# Patient Record
Sex: Male | Born: 1940 | Race: White | Hispanic: No | Marital: Married | Smoking: Never smoker
Health system: Southern US, Community
[De-identification: ages and names within clinical notes are randomized; demographics above are authoritative.]

## PROBLEM LIST (undated history)

## (undated) DIAGNOSIS — I10 Essential (primary) hypertension: Secondary | ICD-10-CM

## (undated) DIAGNOSIS — F0280 Dementia in other diseases classified elsewhere without behavioral disturbance: Secondary | ICD-10-CM

---

## 1998-11-18 ENCOUNTER — Encounter: Payer: Self-pay | Admitting: Urology

## 1998-11-18 ENCOUNTER — Ambulatory Visit (HOSPITAL_COMMUNITY): Admission: RE | Admit: 1998-11-18 | Discharge: 1998-11-18 | Payer: Self-pay | Admitting: Urology

## 1998-12-01 ENCOUNTER — Encounter: Payer: Self-pay | Admitting: General Surgery

## 1998-12-02 ENCOUNTER — Encounter (INDEPENDENT_AMBULATORY_CARE_PROVIDER_SITE_OTHER): Payer: Self-pay | Admitting: Specialist

## 1998-12-02 ENCOUNTER — Observation Stay (HOSPITAL_COMMUNITY): Admission: RE | Admit: 1998-12-02 | Discharge: 1998-12-03 | Payer: Self-pay | Admitting: General Surgery

## 1998-12-02 ENCOUNTER — Encounter: Payer: Self-pay | Admitting: General Surgery

## 1999-12-12 ENCOUNTER — Encounter: Payer: Self-pay | Admitting: Urology

## 1999-12-12 ENCOUNTER — Ambulatory Visit (HOSPITAL_COMMUNITY): Admission: RE | Admit: 1999-12-12 | Discharge: 1999-12-12 | Payer: Self-pay | Admitting: Urology

## 2003-07-02 ENCOUNTER — Emergency Department (HOSPITAL_COMMUNITY): Admission: EM | Admit: 2003-07-02 | Discharge: 2003-07-02 | Payer: Self-pay | Admitting: Emergency Medicine

## 2003-07-13 ENCOUNTER — Emergency Department (HOSPITAL_COMMUNITY): Admission: EM | Admit: 2003-07-13 | Discharge: 2003-07-13 | Payer: Self-pay | Admitting: Emergency Medicine

## 2003-08-18 ENCOUNTER — Ambulatory Visit (HOSPITAL_COMMUNITY): Admission: RE | Admit: 2003-08-18 | Discharge: 2003-08-18 | Payer: Self-pay | Admitting: Family Medicine

## 2003-09-01 ENCOUNTER — Encounter: Admission: RE | Admit: 2003-09-01 | Discharge: 2003-09-01 | Payer: Self-pay | Admitting: Neurology

## 2003-09-10 ENCOUNTER — Emergency Department (HOSPITAL_COMMUNITY): Admission: EM | Admit: 2003-09-10 | Discharge: 2003-09-10 | Payer: Self-pay | Admitting: Emergency Medicine

## 2009-03-09 ENCOUNTER — Ambulatory Visit: Payer: Self-pay | Admitting: Gastroenterology

## 2009-03-11 ENCOUNTER — Telehealth: Payer: Self-pay | Admitting: Gastroenterology

## 2009-03-23 ENCOUNTER — Encounter: Payer: Self-pay | Admitting: Gastroenterology

## 2009-03-23 ENCOUNTER — Ambulatory Visit: Payer: Self-pay | Admitting: Gastroenterology

## 2009-03-25 ENCOUNTER — Encounter: Payer: Self-pay | Admitting: Gastroenterology

## 2010-07-08 ENCOUNTER — Encounter: Payer: Self-pay | Admitting: Family Medicine

## 2014-01-28 ENCOUNTER — Encounter: Payer: Self-pay | Admitting: Gastroenterology

## 2020-10-19 ENCOUNTER — Other Ambulatory Visit: Payer: Self-pay

## 2020-10-19 ENCOUNTER — Other Ambulatory Visit: Payer: Self-pay | Admitting: Family Medicine

## 2020-10-19 ENCOUNTER — Ambulatory Visit
Admission: RE | Admit: 2020-10-19 | Discharge: 2020-10-19 | Disposition: A | Discharge status: Home / Self Care | Payer: No Typology Code available for payment source | Source: Ambulatory Visit | Attending: Family Medicine | Admitting: Family Medicine

## 2020-10-19 DIAGNOSIS — R0789 Other chest pain: Secondary | ICD-10-CM

## 2020-10-19 DIAGNOSIS — R0781 Pleurodynia: Secondary | ICD-10-CM

## 2021-05-09 ENCOUNTER — Emergency Department (HOSPITAL_COMMUNITY): Payer: Medicare Other

## 2021-05-09 ENCOUNTER — Other Ambulatory Visit: Payer: Self-pay

## 2021-05-09 ENCOUNTER — Encounter (HOSPITAL_COMMUNITY): Payer: Self-pay | Admitting: Emergency Medicine

## 2021-05-09 DIAGNOSIS — K761 Chronic passive congestion of liver: Secondary | ICD-10-CM | POA: Diagnosis present

## 2021-05-09 DIAGNOSIS — I429 Cardiomyopathy, unspecified: Secondary | ICD-10-CM | POA: Diagnosis present

## 2021-05-09 DIAGNOSIS — Z888 Allergy status to other drugs, medicaments and biological substances status: Secondary | ICD-10-CM

## 2021-05-09 DIAGNOSIS — Y929 Unspecified place or not applicable: Secondary | ICD-10-CM

## 2021-05-09 DIAGNOSIS — Z881 Allergy status to other antibiotic agents status: Secondary | ICD-10-CM | POA: Diagnosis not present

## 2021-05-09 DIAGNOSIS — N179 Acute kidney failure, unspecified: Secondary | ICD-10-CM | POA: Diagnosis present

## 2021-05-09 DIAGNOSIS — Z20822 Contact with and (suspected) exposure to covid-19: Secondary | ICD-10-CM | POA: Diagnosis present

## 2021-05-09 DIAGNOSIS — T4275XA Adverse effect of unspecified antiepileptic and sedative-hypnotic drugs, initial encounter: Secondary | ICD-10-CM | POA: Diagnosis present

## 2021-05-09 DIAGNOSIS — F32A Depression, unspecified: Secondary | ICD-10-CM | POA: Diagnosis present

## 2021-05-09 DIAGNOSIS — I5021 Acute systolic (congestive) heart failure: Secondary | ICD-10-CM | POA: Diagnosis present

## 2021-05-09 DIAGNOSIS — I493 Ventricular premature depolarization: Secondary | ICD-10-CM | POA: Diagnosis present

## 2021-05-09 DIAGNOSIS — Z66 Do not resuscitate: Secondary | ICD-10-CM | POA: Diagnosis present

## 2021-05-09 DIAGNOSIS — Z880 Allergy status to penicillin: Secondary | ICD-10-CM

## 2021-05-09 DIAGNOSIS — Z7982 Long term (current) use of aspirin: Secondary | ICD-10-CM

## 2021-05-09 DIAGNOSIS — G309 Alzheimer's disease, unspecified: Secondary | ICD-10-CM

## 2021-05-09 DIAGNOSIS — Z7189 Other specified counseling: Secondary | ICD-10-CM

## 2021-05-09 DIAGNOSIS — F0283 Dementia in other diseases classified elsewhere, unspecified severity, with mood disturbance: Secondary | ICD-10-CM | POA: Diagnosis present

## 2021-05-09 DIAGNOSIS — G928 Other toxic encephalopathy: Secondary | ICD-10-CM | POA: Diagnosis present

## 2021-05-09 DIAGNOSIS — R778 Other specified abnormalities of plasma proteins: Secondary | ICD-10-CM

## 2021-05-09 DIAGNOSIS — R570 Cardiogenic shock: Secondary | ICD-10-CM | POA: Diagnosis present

## 2021-05-09 DIAGNOSIS — K219 Gastro-esophageal reflux disease without esophagitis: Secondary | ICD-10-CM | POA: Diagnosis present

## 2021-05-09 DIAGNOSIS — R9431 Abnormal electrocardiogram [ECG] [EKG]: Secondary | ICD-10-CM

## 2021-05-09 DIAGNOSIS — Z515 Encounter for palliative care: Secondary | ICD-10-CM

## 2021-05-09 DIAGNOSIS — F0390 Unspecified dementia without behavioral disturbance: Secondary | ICD-10-CM

## 2021-05-09 DIAGNOSIS — F02B11 Dementia in other diseases classified elsewhere, moderate, with agitation: Secondary | ICD-10-CM

## 2021-05-09 DIAGNOSIS — I214 Non-ST elevation (NSTEMI) myocardial infarction: Secondary | ICD-10-CM | POA: Diagnosis present

## 2021-05-09 DIAGNOSIS — I509 Heart failure, unspecified: Secondary | ICD-10-CM

## 2021-05-09 DIAGNOSIS — K529 Noninfective gastroenteritis and colitis, unspecified: Secondary | ICD-10-CM | POA: Diagnosis present

## 2021-05-09 DIAGNOSIS — Z79899 Other long term (current) drug therapy: Secondary | ICD-10-CM | POA: Diagnosis not present

## 2021-05-09 DIAGNOSIS — J189 Pneumonia, unspecified organism: Secondary | ICD-10-CM | POA: Diagnosis present

## 2021-05-09 DIAGNOSIS — R410 Disorientation, unspecified: Secondary | ICD-10-CM

## 2021-05-09 DIAGNOSIS — I130 Hypertensive heart and chronic kidney disease with heart failure and stage 1 through stage 4 chronic kidney disease, or unspecified chronic kidney disease: Secondary | ICD-10-CM | POA: Diagnosis present

## 2021-05-09 DIAGNOSIS — E872 Acidosis, unspecified: Secondary | ICD-10-CM | POA: Diagnosis present

## 2021-05-09 DIAGNOSIS — I4891 Unspecified atrial fibrillation: Secondary | ICD-10-CM | POA: Diagnosis present

## 2021-05-09 DIAGNOSIS — N189 Chronic kidney disease, unspecified: Secondary | ICD-10-CM | POA: Diagnosis present

## 2021-05-09 DIAGNOSIS — F419 Anxiety disorder, unspecified: Secondary | ICD-10-CM | POA: Diagnosis present

## 2021-05-09 HISTORY — DX: Essential (primary) hypertension: I10

## 2021-05-09 HISTORY — DX: Dementia in other diseases classified elsewhere, unspecified severity, without behavioral disturbance, psychotic disturbance, mood disturbance, and anxiety: F02.80

## 2021-05-09 LAB — I-STAT CHEM 8, ED
BUN: 82 mg/dL — ABNORMAL HIGH (ref 8–23)
Calcium, Ion: 1.18 mmol/L (ref 1.15–1.40)
Chloride: 112 mmol/L — ABNORMAL HIGH (ref 98–111)
Creatinine, Ser: 4.5 mg/dL — ABNORMAL HIGH (ref 0.61–1.24)
Glucose, Bld: 107 mg/dL — ABNORMAL HIGH (ref 70–99)
HCT: 40 % (ref 39.0–52.0)
Hemoglobin: 13.6 g/dL (ref 13.0–17.0)
Potassium: 5 mmol/L (ref 3.5–5.1)
Sodium: 142 mmol/L (ref 135–145)
TCO2: 20 mmol/L — ABNORMAL LOW (ref 22–32)

## 2021-05-09 LAB — BLOOD GAS, VENOUS
Acid-base deficit: 6.8 mmol/L — ABNORMAL HIGH (ref 0.0–2.0)
Bicarbonate: 18.9 mmol/L — ABNORMAL LOW (ref 20.0–28.0)
O2 Saturation: 45.5 %
Patient temperature: 98.6
pCO2, Ven: 40.5 mmHg — ABNORMAL LOW (ref 44.0–60.0)
pH, Ven: 7.291 (ref 7.250–7.430)
pO2, Ven: 34.5 mmHg (ref 32.0–45.0)

## 2021-05-09 LAB — URINALYSIS, ROUTINE W REFLEX MICROSCOPIC
Bacteria, UA: NONE SEEN
Bilirubin Urine: NEGATIVE
Glucose, UA: NEGATIVE mg/dL
Hgb urine dipstick: NEGATIVE
Ketones, ur: NEGATIVE mg/dL
Nitrite: NEGATIVE
Protein, ur: 30 mg/dL — AB
Specific Gravity, Urine: 1.018 (ref 1.005–1.030)
pH: 5 (ref 5.0–8.0)

## 2021-05-09 LAB — CBC WITH DIFFERENTIAL/PLATELET
Abs Immature Granulocytes: 0.09 10*3/uL — ABNORMAL HIGH (ref 0.00–0.07)
Basophils Absolute: 0 10*3/uL (ref 0.0–0.1)
Basophils Relative: 0 %
Eosinophils Absolute: 0 10*3/uL (ref 0.0–0.5)
Eosinophils Relative: 0 %
HCT: 38.1 % — ABNORMAL LOW (ref 39.0–52.0)
Hemoglobin: 12.5 g/dL — ABNORMAL LOW (ref 13.0–17.0)
Immature Granulocytes: 1 %
Lymphocytes Relative: 6 %
Lymphs Abs: 0.7 10*3/uL (ref 0.7–4.0)
MCH: 32.2 pg (ref 26.0–34.0)
MCHC: 32.8 g/dL (ref 30.0–36.0)
MCV: 98.2 fL (ref 80.0–100.0)
Monocytes Absolute: 1.2 10*3/uL — ABNORMAL HIGH (ref 0.1–1.0)
Monocytes Relative: 10 %
Neutro Abs: 10 10*3/uL — ABNORMAL HIGH (ref 1.7–7.7)
Neutrophils Relative %: 83 %
Platelets: 172 10*3/uL (ref 150–400)
RBC: 3.88 MIL/uL — ABNORMAL LOW (ref 4.22–5.81)
RDW: 13.7 % (ref 11.5–15.5)
WBC: 12.1 10*3/uL — ABNORMAL HIGH (ref 4.0–10.5)
nRBC: 0 % (ref 0.0–0.2)

## 2021-05-09 LAB — TROPONIN I (HIGH SENSITIVITY)
Troponin I (High Sensitivity): 276 ng/L (ref <18)
Troponin I (High Sensitivity): 286 ng/L (ref <18)
Troponin I (High Sensitivity): 272 ng/L (ref <18)
Troponin I (High Sensitivity): 263 ng/L (ref <18)

## 2021-05-09 LAB — BASIC METABOLIC PANEL
Anion gap: 13 (ref 5–15)
BUN: 83 mg/dL — ABNORMAL HIGH (ref 8–23)
CO2: 15 mmol/L — ABNORMAL LOW (ref 22–32)
Calcium: 8.5 mg/dL — ABNORMAL LOW (ref 8.9–10.3)
Chloride: 112 mmol/L — ABNORMAL HIGH (ref 98–111)
Creatinine, Ser: 4.3 mg/dL — ABNORMAL HIGH (ref 0.61–1.24)
GFR, Estimated: 13 mL/min — ABNORMAL LOW (ref >60)
Glucose, Bld: 119 mg/dL — ABNORMAL HIGH (ref 70–99)
Potassium: 4.8 mmol/L (ref 3.5–5.1)
Sodium: 140 mmol/L (ref 135–145)

## 2021-05-09 LAB — COMPREHENSIVE METABOLIC PANEL
ALT: 87 U/L — ABNORMAL HIGH (ref 0–44)
AST: 93 U/L — ABNORMAL HIGH (ref 15–41)
Albumin: 3.9 g/dL (ref 3.5–5.0)
Alkaline Phosphatase: 74 U/L (ref 38–126)
Anion gap: 12 (ref 5–15)
BUN: 85 mg/dL — ABNORMAL HIGH (ref 8–23)
CO2: 18 mmol/L — ABNORMAL LOW (ref 22–32)
Calcium: 9 mg/dL (ref 8.9–10.3)
Chloride: 110 mmol/L (ref 98–111)
Creatinine, Ser: 4.02 mg/dL — ABNORMAL HIGH (ref 0.61–1.24)
GFR, Estimated: 14 mL/min — ABNORMAL LOW (ref >60)
Glucose, Bld: 113 mg/dL — ABNORMAL HIGH (ref 70–99)
Potassium: 4.6 mmol/L (ref 3.5–5.1)
Sodium: 140 mmol/L (ref 135–145)
Total Bilirubin: 1.4 mg/dL — ABNORMAL HIGH (ref 0.3–1.2)
Total Protein: 6.6 g/dL (ref 6.5–8.1)

## 2021-05-09 LAB — CBC
HCT: 35.4 % — ABNORMAL LOW (ref 39.0–52.0)
HCT: 37.2 % — ABNORMAL LOW (ref 39.0–52.0)
Hemoglobin: 11.4 g/dL — ABNORMAL LOW (ref 13.0–17.0)
Hemoglobin: 11.6 g/dL — ABNORMAL LOW (ref 13.0–17.0)
MCH: 32 pg (ref 26.0–34.0)
MCH: 31.7 pg (ref 26.0–34.0)
MCHC: 32.2 g/dL (ref 30.0–36.0)
MCHC: 31.2 g/dL (ref 30.0–36.0)
MCV: 99.4 fL (ref 80.0–100.0)
MCV: 101.6 fL — ABNORMAL HIGH (ref 80.0–100.0)
Platelets: 161 10*3/uL (ref 150–400)
Platelets: 181 10*3/uL (ref 150–400)
RBC: 3.56 MIL/uL — ABNORMAL LOW (ref 4.22–5.81)
RBC: 3.66 MIL/uL — ABNORMAL LOW (ref 4.22–5.81)
RDW: 13.8 % (ref 11.5–15.5)
RDW: 13.9 % (ref 11.5–15.5)
WBC: 12.8 10*3/uL — ABNORMAL HIGH (ref 4.0–10.5)
WBC: 17.5 10*3/uL — ABNORMAL HIGH (ref 4.0–10.5)
nRBC: 0 % (ref 0.0–0.2)
nRBC: 0.1 % (ref 0.0–0.2)

## 2021-05-09 LAB — RESP PANEL BY RT-PCR (FLU A&B, COVID) ARPGX2
Influenza A by PCR: NEGATIVE
Influenza B by PCR: NEGATIVE
SARS Coronavirus 2 by RT PCR: NEGATIVE

## 2021-05-09 LAB — PROTIME-INR
INR: 1.5 — ABNORMAL HIGH (ref 0.8–1.2)
Prothrombin Time: 18.1 seconds — ABNORMAL HIGH (ref 11.4–15.2)

## 2021-05-09 LAB — URINE CULTURE: Culture: NO GROWTH

## 2021-05-09 LAB — CK: Total CK: 211 U/L (ref 49–397)

## 2021-05-09 LAB — CREATININE, SERUM
Creatinine, Ser: 4.06 mg/dL — ABNORMAL HIGH (ref 0.61–1.24)
GFR, Estimated: 14 mL/min — ABNORMAL LOW (ref >60)

## 2021-05-09 LAB — HEPARIN LEVEL (UNFRACTIONATED): Heparin Unfractionated: 0.54 IU/mL (ref 0.30–0.70)

## 2021-05-09 LAB — CBG MONITORING, ED: Glucose-Capillary: 88 mg/dL (ref 70–99)

## 2021-05-09 LAB — BRAIN NATRIURETIC PEPTIDE: B Natriuretic Peptide: 1963.6 pg/mL — ABNORMAL HIGH (ref 0.0–100.0)

## 2021-05-09 LAB — APTT: aPTT: 32 seconds (ref 24–36)

## 2021-05-09 LAB — MRSA NEXT GEN BY PCR, NASAL: MRSA by PCR Next Gen: NOT DETECTED

## 2021-05-09 LAB — LACTIC ACID, PLASMA
Lactic Acid, Venous: 2 mmol/L (ref 0.5–1.9)
Lactic Acid, Venous: 4.9 mmol/L (ref 0.5–1.9)

## 2021-05-09 MED ORDER — ORAL CARE MOUTH RINSE: 15.0000 mL | Freq: Two times a day (BID) | OROMUCOSAL | Status: DC

## 2021-05-09 MED ORDER — POLYETHYLENE GLYCOL 3350 17 G PO PACK: 17.0000 g | PACK | Freq: Every day | ORAL | Status: DC | PRN

## 2021-05-09 MED ORDER — FUROSEMIDE 10 MG/ML IJ SOLN
80.0000 mg | Freq: Once | INTRAMUSCULAR | Status: AC
  Administered 2021-05-09: 80 mg via INTRAVENOUS
  Filled 2021-05-09: qty 8

## 2021-05-09 MED ORDER — CHLORHEXIDINE GLUCONATE CLOTH 2 % EX PADS
6.0000 | MEDICATED_PAD | Freq: Every day | CUTANEOUS | Status: DC
  Administered 2021-05-09: 6 via TOPICAL

## 2021-05-09 MED ORDER — LATANOPROST 0.005 % OP SOLN
1.0000 [drp] | Freq: Every day | OPHTHALMIC | Status: DC
  Administered 2021-05-10: 1 [drp] via OPHTHALMIC
  Filled 2021-05-09: qty 2.5

## 2021-05-09 MED ORDER — LORAZEPAM 2 MG/ML IJ SOLN: 1.0000 mg | Freq: Once | INTRAMUSCULAR | Status: AC

## 2021-05-09 MED ORDER — SODIUM CHLORIDE 0.9 % IV BOLUS
500.0000 mL | Freq: Once | INTRAVENOUS | Status: AC
  Administered 2021-05-09: 500 mL via INTRAVENOUS

## 2021-05-09 MED ORDER — AMIODARONE HCL IN DEXTROSE 360-4.14 MG/200ML-% IV SOLN
30.0000 mg/h | INTRAVENOUS | Status: DC
  Administered 2021-05-09 – 2021-05-10 (×4): 30 mg/h via INTRAVENOUS
  Filled 2021-05-09 (×4): qty 200

## 2021-05-09 MED ORDER — DOCUSATE SODIUM 100 MG PO CAPS: 100.0000 mg | ORAL_CAPSULE | Freq: Two times a day (BID) | ORAL | Status: DC | PRN

## 2021-05-09 MED ORDER — NOREPINEPHRINE 4 MG/250ML-% IV SOLN
2.0000 ug/min | INTRAVENOUS | Status: DC
  Administered 2021-05-09 (×2): 10 ug/min via INTRAVENOUS
  Administered 2021-05-10: 17 ug/min via INTRAVENOUS
  Administered 2021-05-10: 10 ug/min via INTRAVENOUS
  Administered 2021-05-10: 12 ug/min via INTRAVENOUS
  Administered 2021-05-10: 14 ug/min via INTRAVENOUS
  Filled 2021-05-09 (×6): qty 250

## 2021-05-09 MED ORDER — SODIUM CHLORIDE 0.9 % IV SOLN
100.0000 mg | Freq: Two times a day (BID) | INTRAVENOUS | Status: DC
  Filled 2021-05-09: qty 100

## 2021-05-09 MED ORDER — AMIODARONE HCL IN DEXTROSE 360-4.14 MG/200ML-% IV SOLN
60.0000 mg/h | INTRAVENOUS | Status: AC
  Administered 2021-05-09: 60 mg/h via INTRAVENOUS
  Filled 2021-05-09: qty 200

## 2021-05-09 MED ORDER — ACETAMINOPHEN 325 MG PO TABS: 650.0000 mg | ORAL_TABLET | Freq: Four times a day (QID) | ORAL | Status: DC | PRN

## 2021-05-09 MED ORDER — SODIUM CHLORIDE 0.9 % IV SOLN
100.0000 mg | Freq: Once | INTRAVENOUS | Status: AC
  Administered 2021-05-09: 100 mg via INTRAVENOUS
  Filled 2021-05-09: qty 100

## 2021-05-09 MED ORDER — ACETAMINOPHEN 650 MG RE SUPP: 650.0000 mg | Freq: Four times a day (QID) | RECTAL | Status: DC | PRN

## 2021-05-09 MED ORDER — CHLORHEXIDINE GLUCONATE 0.12 % MT SOLN
15.0000 mL | Freq: Two times a day (BID) | OROMUCOSAL | Status: DC
  Administered 2021-05-09: 15 mL via OROMUCOSAL
  Filled 2021-05-09: qty 15

## 2021-05-09 MED ORDER — SODIUM CHLORIDE 0.9 % IV BOLUS
1000.0000 mL | Freq: Once | INTRAVENOUS | Status: AC
  Administered 2021-05-09: 1000 mL via INTRAVENOUS

## 2021-05-09 MED ORDER — LORAZEPAM 2 MG/ML IJ SOLN
1.0000 mg | Freq: Once | INTRAMUSCULAR | Status: AC
  Administered 2021-05-09: 1 mg via INTRAVENOUS
  Filled 2021-05-09: qty 1

## 2021-05-09 MED ORDER — SODIUM CHLORIDE 0.9 % IV SOLN
250.0000 mL | INTRAVENOUS | Status: DC | PRN
  Administered 2021-05-09: 250 mL via INTRAVENOUS

## 2021-05-09 MED ORDER — LORAZEPAM 2 MG/ML IJ SOLN
1.0000 mg | INTRAMUSCULAR | Status: DC | PRN
  Administered 2021-05-09 – 2021-05-10 (×3): 1 mg via INTRAVENOUS
  Filled 2021-05-09 (×3): qty 1

## 2021-05-09 MED ORDER — HEPARIN (PORCINE) 25000 UT/250ML-% IV SOLN
1100.0000 [IU]/h | INTRAVENOUS | Status: DC
  Administered 2021-05-09: 1100 [IU]/h via INTRAVENOUS
  Filled 2021-05-09: qty 250

## 2021-05-09 MED ORDER — SODIUM CHLORIDE 0.9% FLUSH: 3.0000 mL | INTRAVENOUS | Status: DC | PRN

## 2021-05-09 MED ORDER — HEPARIN BOLUS VIA INFUSION
4000.0000 [IU] | Freq: Once | INTRAVENOUS | Status: AC
  Administered 2021-05-09: 4000 [IU] via INTRAVENOUS
  Filled 2021-05-09: qty 4000

## 2021-05-09 MED ORDER — BISACODYL 10 MG RE SUPP: 10.0000 mg | Freq: Every day | RECTAL | Status: DC | PRN

## 2021-05-09 MED ORDER — SODIUM CHLORIDE 0.9 % IV SOLN
1.0000 g | Freq: Once | INTRAVENOUS | Status: AC
  Administered 2021-05-09: 1 g via INTRAVENOUS
  Filled 2021-05-09: qty 10

## 2021-05-09 MED ORDER — NOREPINEPHRINE 4 MG/250ML-% IV SOLN
0.0000 ug/min | INTRAVENOUS | Status: DC
Start: 2021-05-09 — End: 2021-05-09
  Administered 2021-05-09: 2 ug/min via INTRAVENOUS
  Filled 2021-05-09: qty 250

## 2021-05-09 MED ORDER — SODIUM CHLORIDE 0.9% FLUSH
3.0000 mL | Freq: Two times a day (BID) | INTRAVENOUS | Status: DC
  Administered 2021-05-09 – 2021-05-10 (×2): 3 mL via INTRAVENOUS

## 2021-05-09 MED ORDER — NOREPINEPHRINE 4 MG/250ML-% IV SOLN: 0.0000 ug/min | INTRAVENOUS | Status: DC

## 2021-05-09 MED ORDER — FUROSEMIDE 10 MG/ML IJ SOLN: 40.0000 mg | Freq: Two times a day (BID) | INTRAMUSCULAR | Status: DC

## 2021-05-09 MED ORDER — MICONAZOLE NITRATE 2 % EX CREA
TOPICAL_CREAM | Freq: Two times a day (BID) | CUTANEOUS | Status: DC
  Administered 2021-05-10: 1 via TOPICAL
  Filled 2021-05-09: qty 28

## 2021-05-09 MED ORDER — HYDROMORPHONE HCL 1 MG/ML IJ SOLN
0.2500 mg | INTRAMUSCULAR | Status: DC | PRN
  Administered 2021-05-09 – 2021-05-10 (×3): 0.25 mg via INTRAVENOUS
  Filled 2021-05-09 (×3): qty 1

## 2021-05-09 MED ORDER — LORAZEPAM 2 MG/ML IJ SOLN
INTRAMUSCULAR | Status: AC
  Administered 2021-05-09: 1 mg via INTRAVENOUS
  Filled 2021-05-09: qty 1

## 2021-05-09 MED ORDER — SODIUM BICARBONATE 8.4 % IV SOLN
100.0000 meq | Freq: Once | INTRAVENOUS | Status: AC
Start: 2021-05-09 — End: 2021-05-09
  Administered 2021-05-09: 100 meq via INTRAVENOUS
  Filled 2021-05-09: qty 50

## 2021-05-09 MED ORDER — DEXMEDETOMIDINE HCL IN NACL 200 MCG/50ML IV SOLN
0.4000 ug/kg/h | INTRAVENOUS | Status: DC
  Administered 2021-05-09: 0.4 ug/kg/h via INTRAVENOUS
  Filled 2021-05-09: qty 50

## 2021-05-09 MED ORDER — SODIUM CHLORIDE 0.9 % IV SOLN: 1.0000 g | INTRAVENOUS | Status: DC

## 2021-05-09 MED ORDER — SODIUM CHLORIDE 0.9 % IV SOLN: 250.0000 mL | INTRAVENOUS | Status: DC

## 2021-05-09 MED ORDER — LORAZEPAM 2 MG/ML IJ SOLN: 1.0000 mg | INTRAMUSCULAR | Status: DC | PRN

## 2021-05-09 NOTE — ED Notes (Signed)
Kennedy in lab to add on BNP

## 2021-05-09 NOTE — H&P (Signed)
History and Physical    [name] DOB: [birth year] DOA: 05/04/2021  PCP: Johny Blamer, MD   Patient coming from:  Home  Chief Complaint: Confusion, aggressive behavior  HPI: Duane Woodward is a 79 y.o. male with medical history significant for HTN, dementia who presents by EMS for evaluation of increased confusion and aggressive behavior.  Son is at the bedside and reports over the last 5 days patient's confusion has gotten worse and has become more combative and aggressive.  He is very agitated when EMS arrived to evaluate him and he initially refused transport by EMS so he was given Versed to calm him down and was brought to the emergency room per the family's request.  He is currently living with his daughter.  A month ago his wife of 37 years left him without any indication and since then his condition has deteriorated.  His daughter is gone with him to his medical appointments over the last month with the last visit being last Friday.  At that time his blood pressure was low so he was taken off of his antihypertensive medications.  Family reports that he has been having diarrhea daily for the last 6 weeks.  He has not had any recent travel and there is no change in his diet or raw meat ingestion.  Reportedly he had Hemoccult positive stool at his doctor's office last week but he has not had any overt bleeding or blood in the stool at home.  Going to the family has lost some weight in the last month and he has had decreased appetite with very little p.o. intake.  He is not on any blood thinners. Family reports he does not use tobacco products drink alcohol or use illicit drugs  ED Course: In the emergency room Duane Woodward was found to have atrial fibrillation with RVR and was started on amiodarone.  He did have soft blood pressure readings therefore metoprolol and Cardizem were not provided.  He initially was very obtunded and lethargic when he arrived but he is more alert  now protecting his airway.  Her physician discussed with critical care team but since patient was not intubated on pressors they felt that he could be admitted under the hospitalist service.  Chest x-ray reveals mild pulmonary edema with possible infiltrate in the right lower lateral lobe.  Work reveals sodium 142 potassium 5.0 chloride 112 4.50 BUN 82 bicarb 18, WBC 12,100 hemoglobin 12.5 hematocrit 38.1 platelets 172,000 BNP 1963.6 troponin 276 lactic acid 2.0.  Patient was started on amiodarone in the emergency room.  He was started on antibiotics with Rocephin and doxycycline.  Hospitalist service asked to admit for further management  Review of Systems:  Unable to obtain review of systems secondary to dementia and acute condition  Past Medical History:  Diagnosis Date   Alzheimer's dementia (HCC)    Hypertension     History reviewed. No pertinent surgical history.  Social History  reports that he has never smoked. He has never used smokeless tobacco. He reports that he does not currently use alcohol. He reports that he does not currently use drugs.  Allergies  Allergen Reactions   Amoxicillin-Pot Clavulanate     Other reaction(s): Ulcers in mouth   Atenolol     Other reaction(s): Unknown   Donepezil Hcl     Other reaction(s): hallucinations   Hydrochlorothiazide     Other reaction(s): headache   Indapamide     Other reaction(s): headache   Olmesartan  Other reaction(s): Unknown   Pravastatin     Other reaction(s): nausea   Sulfamethoxazole     Other reaction(s): GI Upset (intolerance)    History reviewed. No pertinent family history.   Prior to Admission medications   Medication Sig Start Date End Date Taking? Authorizing Provider  acetaminophen (TYLENOL) 650 MG CR tablet Take 650 mg by mouth every 8 (eight) hours as needed for pain.   Yes [provider]  Ascorbic Acid (VITAMIN C) 100 MG tablet Take 100 mg by mouth daily.   Yes [provider]   aspirin EC 81 MG tablet Take 81 mg by mouth daily. Swallow whole.   Yes [provider]  benazepril (LOTENSIN) 10 MG tablet Take 10 mg by mouth daily. 02/18/21  Yes [provider]  bisacodyl (DULCOLAX) 5 MG EC tablet Take 5 mg by mouth daily as needed for moderate constipation, mild constipation or severe constipation.   Yes [provider]  calcium carbonate (TUMS - DOSED IN MG ELEMENTAL CALCIUM) 500 MG chewable tablet Chew 1 tablet by mouth daily as needed for indigestion or heartburn.   Yes [provider]  cholecalciferol (VITAMIN D3) 25 MCG (1000 UNIT) tablet Take 1,000 Units by mouth daily.   Yes [provider]  docusate sodium (COLACE) 100 MG capsule Take 100 mg by mouth daily as needed for mild constipation or moderate constipation.   Yes [provider]  ibuprofen (ADVIL) 200 MG tablet Take 200 mg by mouth every 6 (six) hours as needed for fever, headache, mild pain or moderate pain.   Yes [provider]  latanoprost (XALATAN) 0.005 % ophthalmic solution 1 drop at bedtime. 04/23/21  Yes [provider]  LORazepam (ATIVAN) 0.5 MG tablet Take 0.25-0.5 mg by mouth 2 (two) times daily. 04/14/21  Yes [provider]  Multiple Vitamin (MULTIVITAMIN WITH MINERALS) TABS tablet Take 1 tablet by mouth daily.   Yes [provider]  polyethylene glycol (MIRALAX / GLYCOLAX) 17 g packet Take 17 g by mouth daily as needed for moderate constipation, severe constipation or mild constipation.   Yes [provider]  simethicone (MYLICON) 0000000 MG chewable tablet Chew 125 mg by mouth every 6 (six) hours as needed for flatulence.   Yes [provider]    Physical Exam: Vitals:   04/22/2021 0150 05/04/2021 0230 05/04/2021 0245 05/07/2021 0300  BP:  102/73 93/74 101/76  Pulse:  (!) 143 80 (!) 131  Resp:  (!) 44 (!) 28 (!) 26  Temp:      TempSrc:      SpO2:  95% 98% 95%  Weight: 91.6 kg     Height: 6' (1.829  m)       Constitutional: NAD, calm, comfortable Vitals:   04/18/2021 0150 04/26/2021 0230 05/11/2021 0245 04/23/2021 0300  BP:  102/73 93/74 101/76  Pulse:  (!) 143 80 (!) 131  Resp:  (!) 44 (!) 28 (!) 26  Temp:      TempSrc:      SpO2:  95% 98% 95%  Weight: 91.6 kg     Height: 6' (1.829 m)      General: WDWN, Alert and oriented to self  Eyes: EOMI, PERRL, conjunctivae normal.  Sclera nonicteric HENT:  [place]/AT, external ears normal.  Nares patent without epistasis.  Mucous membranes are moist.  Neck: Soft, normal range of motion, supple, no masses, Trachea midline Respiratory:  Diminished breath sounds in RLL with basilar crackles. Diffuse rales bilaterally. no wheezing. Normal  respiratory effort. No accessory muscle use.  Cardiovascular: Irregular rhythm with tachycardia.  No murmurs / rubs / gallops.  Mild lower extremity edema. 1+ pedal pulses.  Abdomen: Soft, no tenderness, nondistended, no rebound or guarding.  No masses palpated. Bowel sounds normoactive Musculoskeletal: Extremities in restraints. Moves feet and hands spontaneously. No cyanosis. No joint deformity upper and lower extremities. Normal muscle tone.  Skin: Warm, dry, intact no rashes, lesions, ulcers. No induration Neurologic: CN 2-12 grossly intact.  Normal speech.  Sensation intact to touch. Strength 4/5 in all extremities.   Psychiatric: Alert. Confused. Agitated mood.  CHAD-VASC:  3   Labs on Admission: I have personally reviewed following labs and imaging studies  CBC: Recent Labs  Lab 2020/07/02 0047 2020/07/02 0118  WBC 12.1*  --   NEUTROABS 10.0*  --   HGB 12.5* 13.6  HCT 38.1* 40.0  MCV 98.2  --   PLT 172  --     Basic Metabolic Panel: Recent Labs  Lab 2020/07/02 0047 2020/07/02 0118  NA 140 142  K 4.6 5.0  CL 110 112*  CO2 18*  --   GLUCOSE 113* 107*  BUN 85* 82*  CREATININE 4.02* 4.50*  CALCIUM 9.0  --     GFR: Estimated Creatinine Clearance: 13.9 mL/min (A) (by C-G formula based on SCr of 4.5  mg/dL (H)).  Liver Function Tests: Recent Labs  Lab 2020/07/02 0047  AST 93*  ALT 87*  ALKPHOS 74  BILITOT 1.4*  PROT 6.6  ALBUMIN 3.9    Urine analysis:    Component Value Date/Time   COLORURINE AMBER (A) Jul 24, 2020 0042   APPEARANCEUR HAZY (A) Jul 24, 2020 0042   LABSPEC 1.018 Jul 24, 2020 0042   PHURINE 5.0 Jul 24, 2020 0042   GLUCOSEU NEGATIVE Jul 24, 2020 0042   HGBUR NEGATIVE Jul 24, 2020 0042   BILIRUBINUR NEGATIVE Jul 24, 2020 0042   KETONESUR NEGATIVE Jul 24, 2020 0042   PROTEINUR 30 (A) Jul 24, 2020 0042   NITRITE NEGATIVE Jul 24, 2020 0042   LEUKOCYTESUR TRACE (A) Jul 24, 2020 0042    Radiological Exams on Admission: CT Head Wo Contrast  Result Date: Jul 24, 2020 CLINICAL DATA:  Mental status change, unknown cause EXAM: CT HEAD WITHOUT CONTRAST TECHNIQUE: Contiguous axial images were obtained from the base of the skull through the vertex without intravenous contrast. COMPARISON:  None. FINDINGS: Brain: Age related atrophy. No intracranial hemorrhage, mass effect, or midline shift. No hydrocephalus. The basilar cisterns are patent. Mild periventricular chronic small vessel ischemia, normal for age. No evidence of territorial infarct or acute ischemia. No extra-axial or intracranial fluid collection. Vascular: No hyperdense vessel or unexpected calcification. Skull: No fracture or focal lesion. Sinuses/Orbits: No acute finding. Occasional mucosal thickening of paranasal sinuses. No mastoid effusion. Other: None. IMPRESSION: 1. No acute intracranial abnormality. 2. Age related atrophy and chronic small vessel ischemia. Electronically Signed   By: Narda RutherfordMelanie  Sanford M.D.   On: Jul 24, 2020 01:30   DG Chest Port 1 View  Result Date: Jul 24, 2020 CLINICAL DATA:  Possible sepsis, decreased blood pressure EXAM: PORTABLE CHEST 1 VIEW COMPARISON:  10/19/2020 FINDINGS: Cardiac shadow is enlarged. Tortuous thoracic aorta is noted. Central vascular congestion is noted with mild interstitial edema. Mild right  basilar atelectasis is seen. No new bony abnormality is noted. IMPRESSION: Changes of mild CHF. Mild right basilar atelectasis is noted as well. Electronically Signed   By: Alcide CleverMark  Lukens M.D.   On: Jul 24, 2020 01:40    EKG: Independently reviewed.  EKG shows atrial fibrillation with RVR.  No acute ST elevation or depression.  QTc 505  Assessment/Plan Principal Problem:   NSTEMI (non-ST elevated myocardial infarction) Duane Woodward is admitted to ICU.  Troponin level elevated at 285. Check serial troponin levels.  Placed on heparin infusion. Pharmacy to manage heparin.   Active Problems:   Acute CHF (congestive heart failure)  Diuresis with lasix bid for next two days. Monitor I&Os, daily weight.  Obtain Echocardiogram in am to evaluate wall motion, EF and valvular function.  BP is soft so will not start ACEI, ARB or beta blocker therapy until stabilized.     ARF (acute renal failure)  Creatinine 4.5. unknown baseline. Son reports he has had diarrhea for past few weeks so will check C.Diff as it can cause ARF.  Monitor electrolytes and renal function in am with labs.  Avoid nephrotoxic medications.     Atrial fibrillation with RVR  Pt started on amiodarone in the emergency room.  Check echocardiogram in the morning. Patient placed on heparin infusion    CAP (community acquired pneumonia) Chest x-ray reveals probable infiltrate in the right lower lobe.  Started on Rocephin and doxycycline for antibiotic coverage.    Prolonged QT interval Avoid medications which could further prolong QT interval.  Monitor on telemetry    Dementia Son reports patient has had confusion and suspected dementia that has gotten worse over the last month.   DVT prophylaxis: Heparin for DVT prophylaxis. On heparin infusion with NSTEMI and a-fib  Code Status:   Full Code  Family Communication:  Diagnosis and plan discussed with son who is at bedside.  Verbalized understanding and agrees with plan.  Further  recommendations to follow as clinical indicated Disposition Plan:   Patient is from:  Home  Anticipated DC to:  To be determined  Anticipated DC date:  Anticipate 2 midnight or more stay in the hospital  Admission status:  Inpatient  Yevonne Aline Lorenz Donley MD [facility]  How to contact the [facility] Attending or Consulting provider [place] or covering provider during after hours [place], for this patient?   Check the care team in [facility] and look for a) attending/consulting [facility] provider listed and b) the [facility] team listed Log into [email or web address] and use [place]'s universal password to access. If you do not have the password, please contact the hospital operator. Locate the [facility] provider you are looking for under [facility] and page to a number that you can be directly reached. If you still have difficulty reaching the provider, please page the [facility] (Director on Call) for the Hospitalists listed on amion for assistance.  05/06/2021, 3:22 AM

## 2021-05-09 NOTE — Progress Notes (Signed)
ANTICOAGULATION CONSULT NOTE - follow up  Pharmacy Consult for Heparin Indication: atrial fibrillation  Allergies  Allergen Reactions   Amoxicillin-Pot Clavulanate     Other reaction(s): Ulcers in mouth   Atenolol     Other reaction(s): Unknown   Donepezil Hcl     Other reaction(s): hallucinations   Hydrochlorothiazide     Other reaction(s): headache   Indapamide     Other reaction(s): headache   Olmesartan     Other reaction(s): Unknown   Pravastatin     Other reaction(s): nausea   Sulfamethoxazole     Other reaction(s): GI Upset (intolerance)    Patient Measurements: Height: 6' (182.9 cm) Weight: 91.6 kg (202 lb) IBW/kg (Calculated) : 77.6 Heparin Dosing Weight: TBW  Vital Signs: BP: 112/73 (11/22 1147) Pulse Rate: 129 (11/22 1147)  Labs: Recent Labs    2021-05-23 0047 05/23/2021 0118 2021/05/23 0225 2021/05/23 0515 05-23-2021 0700 05-23-21 1200 May 23, 2021 1203  HGB 12.5* 13.6  --  11.4*  --   --  11.6*  HCT 38.1* 40.0  --  35.4*  --   --  37.2*  PLT 172  --   --  161  --   --  181  APTT 32  --   --   --   --   --   --   LABPROT 18.1*  --   --   --   --   --   --   INR 1.5*  --   --   --   --   --   --   HEPARINUNFRC  --   --   --   --   --  0.54  --   CREATININE 4.02* 4.50*  --  4.30*  --   --  4.06*  CKTOTAL  --   --  211  --   --   --   --   TROPONINIHS 276*  --  286* 272* 263*  --   --      Estimated Creatinine Clearance: 15.4 mL/min (A) (by C-G formula based on SCr of 4.06 mg/dL (H)).   Medical History: Past Medical History:  Diagnosis Date   Alzheimer's dementia (HCC)    Hypertension     Medications:  Infusions:   sodium chloride Stopped (05/23/2021 2707)   sodium chloride     amiodarone 30 mg/hr (23-May-2021 0837)   [START ON 05/01/2021] cefTRIAXone (ROCEPHIN)  IV     doxycycline (VIBRAMYCIN) IV     heparin 1,100 Units/hr (05/23/2021 0405)   norepinephrine (LEVOPHED) Adult infusion      Assessment: 80 yoM Afib of unknown duration, elevated  troponin, acute kidney injury.  Not on anticoagulation PTA.  He does take aspirin daily per PTA med list.  Pharmacy consulted to dose IV heparin. CBC- Hg slightly low, pltc WNL.  Noted to have heme + stool at recent PCP visit but no active bleeding noted with admission.  Baseline INR 1.5, aptt WNL.   1st heparin level therapeutic on current IV heparin rate of 1100 units/hr CBC ok No reported bleeding  Goal of Therapy:  Heparin level 0.3-0.7 units/ml Monitor platelets by anticoagulation protocol: Yes   Plan:  Continue IV heparin at current rate of 1100 units/hr Recheck heparin level in 8hrs to confirm continued goal level at current IV heparin rate Daily heparin level & CBC.  Monitor closely for s/sx of bleeding.   Hessie Knows, PharmD, BCPS Secure Chat if ?s May 23, 2021 1:07 PM

## 2021-05-09 NOTE — ED Notes (Signed)
Patient is combative and agitated, hitting his hands on the rails, screaming. Daughter and son have been at the bedside.

## 2021-05-09 NOTE — ED Notes (Signed)
Patient is no longer on precedex however is becoming more agitated again. Patient is screaming asking for help and hitting his hand against the rails.

## 2021-05-09 NOTE — ED Notes (Signed)
Called Dr Rachael Darby to advise of low blood pressure.

## 2021-05-09 NOTE — H&P (Addendum)
NAME:  ALCARIO TINKEY, MRN:  [id number], DOB:  1940/08/27, LOS: 0 ADMISSION DATE:  05/27/21, CONSULTATION DATE:  May 27, 2021 REFERRING MD:  Rhona Leavens, CHIEF COMPLAINT:  confusion, agitation   History of Present Illness:  80yM with history of HTN, dementia who was BIBEMS for increasing confusion and aggressive behavior. Living with daughter - prior to 6 weeks ago he was able to take care of most ADLs including finances but has gradually had more and more limitation, over last week has difficulty with ambulation, dressing, feeding independently. Diarrhea over last 6 weeks, recent low blood pressure taken off of antihypertensives, hemoccult positive at PCP office last week but no frankly bloody stools, little PO intake. With EMS he was given 5mg  IM versed for agitation prior to arrival.  In ED found to have AF/RVR, started on amiodarone, hypotension given 500cc saline, started on precedex for agitation and then levophed gtt for hypotension, given ceftriaxone and doxycycline with concern for pneumonia. Given ativan 1mg  twice for agitation.  Pertinent  Medical History  HTN Alzheimer Dementia  Significant Hospital Events: Including procedures, antibiotic start and stop dates in addition to other pertinent events   11/22 admitted, started on amio for AF/RVR, levo for cardiogenic shock, intermittent ativan for agitation  Interim History / Subjective:    Objective   Blood pressure 99/81, pulse (!) 102, temperature 99.8 F (37.7 C), temperature source Rectal, resp. rate 20, height 6' (1.829 m), weight 91.6 kg, SpO2 100 %.       No intake or output data in the 24 hours ending 2021-05-27 0854 Filed Weights   May 27, 2021 0150  Weight: 91.6 kg    Examination: General appearance: 80 y.o., male, drowsy  Eyes: anicteric sclerae, moist conjunctivae; does not track HENT: NCAT; dry MM Neck: Trachea midline; no lymphadenopathy Lungs: Diminished bilaterally, no crackles, no wheeze, with normal respiratory  effort CV: tachycardic IRIR, no MRGs  Abdomen: Soft, non-tender; non-distended, BS present  Extremities: 2+ ble edema Skin: lukewarm, intertriginous rash under pannus bilateral inguinal creases Neuro: grimaces with painful stimulatin, moves all extremities    7.29/40 CK 211 BNP 1900 Trops 200s S Cr 4 Bicarb 15 AG 13   CXR with bilateral interstitial and alveolar opacities, right and possibly left pleural effusions  EKG SVT with aberrancy  Resolved Hospital Problem list     Assessment & Plan:   # Acute encephalopathy # Dementia Toxic metabolic related to recent sedating medications, renal failure as well as likely low output state from heart failure suspected. Possible sepsis from pneumonia as well. - mgmt of cardiogenic shock as below - CAP coverage - delirium precautions  # Cardiogenic shock Bedside EF looks severely depressed, IVC distended without respiratory variation. Lower suspicion for superimposed sepsis from pneumonia but possible.  - trend LFTs - if supportive measures continued will place central and trend coox - levo for MAP 60-65, may add fixed dose dobutamine depending on evolution of goals of care - palliative care consulted, appreciate assistance.  # Troponinemia: - suspect demand  # AKI vs AKI on CKD # [place] metabolic acidosis - diuretic challenge  # Transaminitis Suspect congestive hepatopathy - trend  # AF and RVR - amiodarone - heparin gtt     Best Practice (right click and "Reselect all SmartList Selections" daily)   Diet/type: NPO DVT prophylaxis: systemic heparin GI prophylaxis: N/A Lines: N/A Foley:  Yes, and it is still needed Code Status:  DNR Last date of multidisciplinary goals of care discussion: Spoke with daughter at length  in ED. If desire time-limited trial to see if he can recover from this I told her that it would likely require intubation (so he wouldn't pull out lines due to ongoing issues with agitation), central  line placement for optimization of inotropes/pressors, high likelihood of needing dialysis, and would probably involve a lengthy period of time away from home without guarantee that he'd recover at all. She doesn't think he'd want any of that and she is speaking with her family as she is considering implementing comfort measures only. He was made DNR and palliative care was consulted as above.  Labs   CBC: Recent Labs  Lab 05/07/2021 0047 04/23/2021 0118 04/19/2021 0515  WBC 12.1*  --  12.8*  NEUTROABS 10.0*  --   --   HGB 12.5* 13.6 11.4*  HCT 38.1* 40.0 35.4*  MCV 98.2  --  99.4  PLT 172  --  [id number]    Basic Metabolic Panel: Recent Labs  Lab 05/16/2021 0047 04/23/2021 0118 04/24/2021 0515  NA 140 142 140  K 4.6 5.0 4.8  CL 110 112* 112*  CO2 18*  --  15*  GLUCOSE 113* 107* 119*  BUN 85* 82* 83*  CREATININE 4.02* 4.50* 4.30*  CALCIUM 9.0  --  8.5*   GFR: Estimated Creatinine Clearance: 14.5 mL/min (A) (by C-G formula based on SCr of 4.3 mg/dL (H)). Recent Labs  Lab 04/28/2021 0047 04/20/2021 0225 04/30/2021 0515  WBC 12.1*  --  12.8*  LATICACIDVEN 2.0* 4.9*  --     Liver Function Tests: Recent Labs  Lab 05/11/2021 0047  AST 93*  ALT 87*  ALKPHOS 74  BILITOT 1.4*  PROT 6.6  ALBUMIN 3.9   No results for input(s): LIPASE, AMYLASE in the last 168 hours. No results for input(s): AMMONIA in the last 168 hours.  ABG    Component Value Date/Time   HCO3 18.9 (L) 05/03/2021 0050   TCO2 20 (L) 04/26/2021 0118   ACIDBASEDEF 6.8 (H) 05/17/2021 0050   O2SAT 45.5 05/14/2021 0050     Coagulation Profile: Recent Labs  Lab 05/08/2021 0047  INR 1.5*    Cardiac Enzymes: Recent Labs  Lab 05/13/2021 0225  CKTOTAL 211    HbA1C: No results found for: HGBA1C  CBG: Recent Labs  Lab 05/08/2021 0109  GLUCAP 88    Review of Systems:   Unable to obtain in setting of his encephalopathy  Past Medical History:  He,  has a past medical history of Alzheimer's dementia ([place]) and  Hypertension.   Surgical History:  History reviewed. No pertinent surgical history.   Social History:   reports that he has never smoked. He has never used smokeless tobacco. He reports that he does not currently use alcohol. He reports that he does not currently use drugs.   Family History:  His family history is not on file.   Allergies Allergies  Allergen Reactions   Amoxicillin-Pot Clavulanate     Other reaction(s): Ulcers in mouth   Atenolol     Other reaction(s): Unknown   Donepezil Hcl     Other reaction(s): hallucinations   Hydrochlorothiazide     Other reaction(s): headache   Indapamide     Other reaction(s): headache   Olmesartan     Other reaction(s): Unknown   Pravastatin     Other reaction(s): nausea   Sulfamethoxazole     Other reaction(s): GI Upset (intolerance)     Home Medications  Prior to Admission medications   Medication Sig Start Date  End Date Taking? Authorizing Provider  acetaminophen (TYLENOL) 650 MG CR tablet Take 650 mg by mouth every 8 (eight) hours as needed for pain.   Yes [provider]  Ascorbic Acid (VITAMIN C) 100 MG tablet Take 100 mg by mouth daily.   Yes [provider]  aspirin EC 81 MG tablet Take 81 mg by mouth daily. Swallow whole.   Yes [provider]  benazepril (LOTENSIN) 10 MG tablet Take 10 mg by mouth daily. 02/18/21  Yes [provider]  bisacodyl (DULCOLAX) 5 MG EC tablet Take 5 mg by mouth daily as needed for moderate constipation, mild constipation or severe constipation.   Yes [provider]  calcium carbonate (TUMS - DOSED IN MG ELEMENTAL CALCIUM) 500 MG chewable tablet Chew 1 tablet by mouth daily as needed for indigestion or heartburn.   Yes [provider]  cholecalciferol (VITAMIN D3) 25 MCG (1000 UNIT) tablet Take 1,000 Units by mouth daily.   Yes [provider]  docusate sodium (COLACE) 100 MG capsule Take 100 mg by mouth daily as needed for mild  constipation or moderate constipation.   Yes [provider]  ibuprofen (ADVIL) 200 MG tablet Take 200 mg by mouth every 6 (six) hours as needed for fever, headache, mild pain or moderate pain.   Yes [provider]  latanoprost (XALATAN) 0.005 % ophthalmic solution 1 drop at bedtime. 04/23/21  Yes [provider]  LORazepam (ATIVAN) 0.5 MG tablet Take 0.25-0.5 mg by mouth 2 (two) times daily. 04/14/21  Yes [provider]  Multiple Vitamin (MULTIVITAMIN WITH MINERALS) TABS tablet Take 1 tablet by mouth daily.   Yes [provider]  polyethylene glycol (MIRALAX / GLYCOLAX) 17 g packet Take 17 g by mouth daily as needed for moderate constipation, severe constipation or mild constipation.   Yes [provider]  simethicone (MYLICON) 0000000 MG chewable tablet Chew 125 mg by mouth every 6 (six) hours as needed for flatulence.   Yes [provider]     Critical care time: 45 minutes

## 2021-05-09 NOTE — ED Notes (Signed)
Hospitalist at bedside 

## 2021-05-09 NOTE — ED Notes (Signed)
Spoke with Dr Rachael Darby and updated him on the patients blood pressure. He is also aware that the lasix has not been given

## 2021-05-09 NOTE — Progress Notes (Signed)
At bedside. U/S-guided PIV placed. RN aware, and to run vasopressors through the new PIV. Rn told to remove other PIV lines

## 2021-05-09 NOTE — ED Notes (Signed)
Verbal order from Dr Rachael Darby to start Levophed

## 2021-05-09 NOTE — ED Notes (Signed)
Patient can now verbalize his name and birthday. Daughter is at bedside and he is speaking to her. Patient is now more agitated

## 2021-05-09 NOTE — ED Notes (Signed)
Verbal order from Dr Rachael Darby to give 1mg  ativan IV and bolus to support blood pressure and for agitation.

## 2021-05-09 NOTE — Progress Notes (Signed)
PROGRESS NOTE    Duane Woodward  KGM:[id number] DOB: Dec 04, 1940 DOA: 05-20-21 PCP: Johny Blamer, MD    Brief Narrative:  80yo with hx HTN, dementia initially presented via EMS with increased confusion and agitation. Family had reported agitation worsening over the past week leading up to this admit. Reportedly had poor PO intake. In the ED, pt was noted to go into afib RVR, started on amiodarone gtt given hypotension. CXR was concerning for pulmonary edema with BNP 1963, lactate 2.0. Hospitalist was consulted for consideration for admission  Assessment & Plan:   Principal Problem:   NSTEMI (non-ST elevated myocardial infarction) (HCC) Active Problems:   Acute CHF (congestive heart failure) (HCC)   ARF (acute renal failure) (HCC)   CAP (community acquired pneumonia)   Dementia (HCC)   Atrial fibrillation with RVR (HCC)   Prolonged QT interval   Cardiogenic shock (HCC)  Principal Problem: Cardiogenic shock with acute CHF exacerbation -Presenting chest imaging suggestive of pulmonary edema, BNP over 1900. -Overnight events noted, patient became hypotensive earlier this morning, did not respond to gentle IV fluid boluses. -Patient was subsequently started on IV pressor support, needing increasing pressor support as the morning progressed -Patient seen this morning with patient dependent on IV pressors -See vital sign trends, patient became increasingly hypotensive with systolic blood pressure in the 70s.  Have ordered additional 1 L bolus of fluids this morning -Consulted critical care service given hemodynamic instability -Cardiology service was consulted this morning -Patient has since been accepted by critical care service and is in ICU at this time.  Afib RVR -Patient had been continued on IV amiodarone. -Cardiology was consulted to assist with rate control as well. - ARF -Presenting creatinine in excess of 4.5 -Was given trials of IV fluid hydration overnight  CAP  with severe sepsis at time of presentation -Chest imaging suggestive of infiltrate -Patient was started on Rocephin and doxycycline at time of presentation -Presenting white blood count of 12.8 thousand, increased to 17.5 thousand -Lactate of 4.9 -Afebrile  Dementia -Agitated at bedside, patient needing anxiolytic overnight and this morning  Lactic acidosis -Presenting lactic acid in excess of 4.5 per above -Was given trial of IV fluids early this morning  Elevated troponin -Troponin peaking at 286, now slowly trending down -Cardiology is following   DVT prophylaxis: Heparin gtt Code Status: DNR Family Communication: Pt in room, family at bedside  Status is: Inpatient  Remains inpatient appropriate because: severity of illness, hemodynamic instability     Consultants:  PCCM Cardiology  Procedures:    Antimicrobials: Anti-infectives (From admission, onward)    Start     Dose/Rate Route Frequency Ordered Stop   05/14/2021 0200  cefTRIAXone (ROCEPHIN) 1 g in sodium chloride 0.9 % 100 mL IVPB        1 g 200 mL/hr over 30 Minutes Intravenous Every 24 hours 05-20-21 0441     05/20/2021 1600  doxycycline (VIBRAMYCIN) 100 mg in sodium chloride 0.9 % 250 mL IVPB        100 mg 125 mL/hr over 120 Minutes Intravenous Every 12 hours May 20, 2021 0441     2021/05/20 0215  cefTRIAXone (ROCEPHIN) 1 g in sodium chloride 0.9 % 100 mL IVPB        1 g 200 mL/hr over 30 Minutes Intravenous  Once 05/20/2021 0206 05-20-21 0301   05/20/21 0215  doxycycline (VIBRAMYCIN) 100 mg in sodium chloride 0.9 % 250 mL IVPB        100 mg 125 mL/hr over  120 Minutes Intravenous  Once 05/01/2021 0206 05/12/2021 [id number]       Subjective: Unable to assess given mentation  Objective: Vitals:   05/16/2021 1113 04/25/2021 1131 05/13/2021 1145 05/12/2021 1147  BP: 95/85 90/75 112/73 112/73  Pulse:    (!) 129  Resp: (!) 21 (!) 21 20 (!) 24  Temp:      TempSrc:      SpO2:    98%  Weight:      Height:       No intake or  output data in the 24 hours ending 05/06/2021 1402 Filed Weights   05/03/2021 0150  Weight: 91.6 kg    Examination: General exam: Awake, laying in bed, in nad, increased agitation Respiratory system: Increased respiratory effort, no wheezing Cardiovascular system: tachycardic, s1, s2 Gastrointestinal system: Soft, nondistended, positive BS Central nervous system: CN2-12 grossly intact, strength intact Extremities: Perfused, no clubbing Skin: Normal skin turgor, no notable skin lesions seen Psychiatry: Unable to assess as pt is agitated and combative  Data Reviewed: I have personally reviewed following labs and imaging studies  CBC: Recent Labs  Lab 05/13/2021 0047 04/30/2021 0118 05/04/2021 0515 04/24/2021 1203  WBC 12.1*  --  12.8* 17.5*  NEUTROABS 10.0*  --   --   --   HGB 12.5* 13.6 11.4* 11.6*  HCT 38.1* 40.0 35.4* 37.2*  MCV 98.2  --  99.4 101.6*  PLT 172  --  161 0000000   Basic Metabolic Panel: Recent Labs  Lab 05/04/2021 0047 04/21/2021 0118 04/28/2021 0515 05/12/2021 1203  NA 140 142 140  --   K 4.6 5.0 4.8  --   CL 110 112* 112*  --   CO2 18*  --  15*  --   GLUCOSE 113* 107* 119*  --   BUN 85* 82* 83*  --   CREATININE 4.02* 4.50* 4.30* 4.06*  CALCIUM 9.0  --  8.5*  --    GFR: Estimated Creatinine Clearance: 15.4 mL/min (A) (by C-G formula based on SCr of 4.06 mg/dL (H)). Liver Function Tests: Recent Labs  Lab 05/04/2021 0047  AST 93*  ALT 87*  ALKPHOS 74  BILITOT 1.4*  PROT 6.6  ALBUMIN 3.9   No results for input(s): LIPASE, AMYLASE in the last 168 hours. No results for input(s): AMMONIA in the last 168 hours. Coagulation Profile: Recent Labs  Lab 05/05/2021 0047  INR 1.5*   Cardiac Enzymes: Recent Labs  Lab 05/02/2021 0225  CKTOTAL 211   BNP (last 3 results) No results for input(s): PROBNP in the last 8760 hours. HbA1C: No results for input(s): HGBA1C in the last 72 hours. CBG: Recent Labs  Lab 04/21/2021 0109  GLUCAP 88   Lipid Profile: No results for  input(s): CHOL, HDL, LDLCALC, TRIG, CHOLHDL, LDLDIRECT in the last 72 hours. Thyroid Function Tests: No results for input(s): TSH, T4TOTAL, FREET4, T3FREE, THYROIDAB in the last 72 hours. Anemia Panel: No results for input(s): VITAMINB12, FOLATE, FERRITIN, TIBC, IRON, RETICCTPCT in the last 72 hours. Sepsis Labs: Recent Labs  Lab 05/03/2021 0047 05/02/2021 0225  LATICACIDVEN 2.0* 4.9*    Recent Results (from the past 240 hour(s))  Resp Panel by RT-PCR (Flu A&B, Covid) Nasopharyngeal Swab     Status: None   Collection Time: 05/08/2021 12:43 AM   Specimen: Nasopharyngeal Swab; Nasopharyngeal(NP) swabs in vial transport medium  Result Value Ref Range Status   SARS Coronavirus 2 by RT PCR NEGATIVE NEGATIVE Final    Comment: (NOTE) SARS-CoV-2 target nucleic acids are  NOT DETECTED.  The SARS-CoV-2 RNA is generally detectable in upper respiratory specimens during the acute phase of infection. The lowest concentration of SARS-CoV-2 viral copies this assay can detect is 138 copies/mL. A negative result does not preclude SARS-Cov-2 infection and should not be used as the sole basis for treatment or other patient management decisions. A negative result may occur with  improper specimen collection/handling, submission of specimen other than nasopharyngeal swab, presence of viral mutation(s) within the areas targeted by this assay, and inadequate number of viral copies(<138 copies/mL). A negative result must be combined with clinical observations, patient history, and epidemiological information. The expected result is Negative.  Fact Sheet for Patients:  BloggerCourse.com  Fact Sheet for Healthcare Providers:  SeriousBroker.it  This test is no t yet approved or cleared by the [place] FDA and  has been authorized for detection and/or diagnosis of SARS-CoV-2 by FDA under an Emergency Use Authorization (EUA). This EUA will remain  in  effect (meaning this test can be used) for the duration of the COVID-19 declaration under Section 564(b)(1) of the Act, 21 U.S.C.section 360bbb-3(b)(1), unless the authorization is terminated  or revoked sooner.       Influenza A by PCR NEGATIVE NEGATIVE Final   Influenza B by PCR NEGATIVE NEGATIVE Final    Comment: (NOTE) The Xpert Xpress SARS-CoV-2/FLU/RSV plus assay is intended as an aid in the diagnosis of influenza from Nasopharyngeal swab specimens and should not be used as a sole basis for treatment. Nasal washings and aspirates are unacceptable for Xpert Xpress SARS-CoV-2/FLU/RSV testing.  Fact Sheet for Patients: BloggerCourse.com  Fact Sheet for Healthcare Providers: SeriousBroker.it  This test is not yet approved or cleared by the [place] FDA and has been authorized for detection and/or diagnosis of SARS-CoV-2 by FDA under an Emergency Use Authorization (EUA). This EUA will remain in effect (meaning this test can be used) for the duration of the COVID-19 declaration under Section 564(b)(1) of the Act, 21 U.S.C. section 360bbb-3(b)(1), unless the authorization is terminated or revoked.  Performed at [facility], [address], [place], [place] [address]      Radiology Studies: CT Head Wo Contrast  Result Date: 05/23/21 CLINICAL DATA:  Mental status change, unknown cause EXAM: CT HEAD WITHOUT CONTRAST TECHNIQUE: Contiguous axial images were obtained from the base of the skull through the vertex without intravenous contrast. COMPARISON:  None. FINDINGS: Brain: Age related atrophy. No intracranial hemorrhage, mass effect, or midline shift. No hydrocephalus. The basilar cisterns are patent. Mild periventricular chronic small vessel ischemia, normal for age. No evidence of territorial infarct or acute ischemia. No extra-axial or intracranial fluid collection. Vascular: No hyperdense vessel or  unexpected calcification. Skull: No fracture or focal lesion. Sinuses/Orbits: No acute finding. Occasional mucosal thickening of paranasal sinuses. No mastoid effusion. Other: None. IMPRESSION: 1. No acute intracranial abnormality. 2. Age related atrophy and chronic small vessel ischemia. Electronically Signed   By: Narda Rutherford M.D.   On: 05/23/2021 01:30   DG Chest Port 1 View  Result Date: May 23, 2021 CLINICAL DATA:  Possible sepsis, decreased blood pressure EXAM: PORTABLE CHEST 1 VIEW COMPARISON:  10/19/2020 FINDINGS: Cardiac shadow is enlarged. Tortuous thoracic aorta is noted. Central vascular congestion is noted with mild interstitial edema. Mild right basilar atelectasis is seen. No new bony abnormality is noted. IMPRESSION: Changes of mild CHF. Mild right basilar atelectasis is noted as well. Electronically Signed   By: Alcide Clever M.D.   On: 05/23/21 01:40  Scheduled Meds:  Chlorhexidine Gluconate Cloth  6 each Topical Daily   furosemide  80 mg Intravenous Once   latanoprost  1 drop Both Eyes QHS   sodium chloride flush  3 mL Intravenous Q12H   Continuous Infusions:  sodium chloride Stopped (05/03/2021 [id number])   sodium chloride     amiodarone 30 mg/hr (05/11/2021 0837)   [START ON May 19, 2021] cefTRIAXone (ROCEPHIN)  IV     doxycycline (VIBRAMYCIN) IV     heparin 1,100 Units/hr (05/16/2021 0405)   norepinephrine (LEVOPHED) Adult infusion      40 minutes of critical care time was spent stabilizing this patient by giving IV fluid hydration to support hemodynamic instability, also time spent consulting cardiology and critical care services   LOS: 0 days   Marylu Lund, MD [facility] Pager On Amion  If 7PM-7AM, please contact night-coverage 04/28/2021, 2:02 PM

## 2021-05-09 NOTE — ED Triage Notes (Signed)
EMS reports patient has increased weakness, diarrhea and altered mental status. Patient became combative while at home and EMS gave 5mg  of IM Versed at midnight. Patient is from home.

## 2021-05-09 NOTE — Progress Notes (Signed)
ANTICOAGULATION CONSULT NOTE - Initial Consult  Pharmacy Consult for Heparin Indication: atrial fibrillation  Allergies  Allergen Reactions   Amoxicillin-Pot Clavulanate     Other reaction(s): Ulcers in mouth   Atenolol     Other reaction(s): Unknown   Donepezil Hcl     Other reaction(s): hallucinations   Hydrochlorothiazide     Other reaction(s): headache   Indapamide     Other reaction(s): headache   Olmesartan     Other reaction(s): Unknown   Pravastatin     Other reaction(s): nausea   Sulfamethoxazole     Other reaction(s): GI Upset (intolerance)    Patient Measurements: Height: 6' (182.9 cm) Weight: 91.6 kg (202 lb) IBW/kg (Calculated) : 77.6 Heparin Dosing Weight: TBW  Vital Signs: Temp: 99.8 F (37.7 C) (11/22 0103) Temp Source: Rectal (11/22 0103) BP: 101/76 (11/22 0300) Pulse Rate: 131 (11/22 0300)  Labs: Recent Labs    06/04/2021 0047 06/04/2021 0118 06-04-2021 0225  HGB 12.5* 13.6  --   HCT 38.1* 40.0  --   PLT 172  --   --   APTT 32  --   --   LABPROT 18.1*  --   --   INR 1.5*  --   --   CREATININE 4.02* 4.50*  --   CKTOTAL  --   --  211  TROPONINIHS 276*  --  286*    Estimated Creatinine Clearance: 13.9 mL/min (A) (by C-G formula based on SCr of 4.5 mg/dL (H)).   Medical History: Past Medical History:  Diagnosis Date   Alzheimer's dementia (HCC)    Hypertension     Medications:  Infusions:   amiodarone 60 mg/hr (2021-06-04 0214)   amiodarone     dexmedetomidine (PRECEDEX) IV infusion 0.4 mcg/kg/hr (04-Jun-2021 0317)   doxycycline (VIBRAMYCIN) IV      Assessment: Duane Woodward Afib of unknown duration, elevated troponin, acute kidney injury.  Not on anticoagulation PTA.  He does take aspirin daily per PTA med list.  Pharmacy consulted to dose IV heparin. CBC- Hg slightly low, pltc WNL.  Noted to have heme + stool at recent PCP visit but no active bleeding noted with admission.  Baseline INR 1.5, aptt WNL.   Goal of Therapy:  Heparin level  0.3-0.7 units/ml Monitor platelets by anticoagulation protocol: Yes   Plan:  Heparin 4000 units IV bolus x1 then 1100 units/hr Check heparin level in 8hrs Daily heparin level & CBC.  Monitor closely for s/sx of bleeding.  Junita Push PharmD 06-04-2021,3:22 AM

## 2021-05-09 NOTE — ED Notes (Signed)
Back from Ct.

## 2021-05-09 NOTE — ED Provider Notes (Signed)
[facility] DEPT Provider Note   CSN: [id number] Arrival date & time: 05/08/2021  [id number]     History Chief Complaint  Patient presents with   Altered Mental Status    Duane Woodward is a 80 y.o. male.  The history is provided by the EMS personnel.  Duane Woodward is a 80 y.o. male who presents to the Emergency Department complaining of aggressive behavior. Level V caveat due to altered mental status. History is provided by EMS. He presents the emergency department by EMS from home for evaluation of change in mental status and aggressive behavior over the last five days. EMS reports that patient was combative and aggressive and refused transport and he was treated with 5 mg of IM Versed prior to ED arrival. He currently lives with family.  Additional history available from patient's daughter and power of attorney after his initial ED presentation. She states that she knows very little about his past medical history. About six weeks ago his wife left him and he moved in with his daughter. She states that he was diagnosed with dementia or Alzheimer's. Has a history of hypertension. He has seen a physician twice since he has been in her care. Once was at the end of October and his blood pressure medications were decreased due to hypotension. He was seen again on Friday due to ongoing diarrhea for the last six weeks. On that evaluation he was found to be heme occult positive. No reports of bleeding at home, but he does not disclose this to family. He does have associated weight loss. Over the last week he has had minimal to eat and drink. He has appeared more confused and lethargic over the last few days and was profoundly worse today. EMS was called due to the significant worsening in his mental status. At baseline he is forgetful but alert and interactive. He does not take any blood thinners.    Past Medical History:  Diagnosis Date   Alzheimer's dementia [facility])     Hypertension     Patient Active Problem List   Diagnosis Date Noted   NSTEMI (non-ST elevated myocardial infarction) ([place]) 04/28/2021   Acute CHF (congestive heart failure) ([place]) 05/01/2021   ARF (acute renal failure) ([place]) 05/04/2021   CAP (community acquired pneumonia) 05/07/2021   Dementia ([place]) 05/08/2021   Atrial fibrillation with RVR ([place]) 05/08/2021   Prolonged QT interval 04/25/2021    History reviewed. No pertinent surgical history.     History reviewed. No pertinent family history.  Social History   Tobacco Use   Smoking status: Never   Smokeless tobacco: Never  Vaping Use   Vaping Use: Never used  Substance Use Topics   Alcohol use: Not Currently   Drug use: Not Currently    Home Medications Prior to Admission medications   Medication Sig Start Date End Date Taking? Authorizing Provider  acetaminophen (TYLENOL) 650 MG CR tablet Take 650 mg by mouth every 8 (eight) hours as needed for pain.   Yes [provider]  Ascorbic Acid (VITAMIN C) 100 MG tablet Take 100 mg by mouth daily.   Yes [provider]  aspirin EC 81 MG tablet Take 81 mg by mouth daily. Swallow whole.   Yes [provider]  benazepril (LOTENSIN) 10 MG tablet Take 10 mg by mouth daily. 02/18/21  Yes [provider]  bisacodyl (DULCOLAX) 5 MG EC tablet Take 5 mg by mouth daily as needed for moderate constipation, mild constipation  or severe constipation.   Yes [provider]  calcium carbonate (TUMS - DOSED IN MG ELEMENTAL CALCIUM) 500 MG chewable tablet Chew 1 tablet by mouth daily as needed for indigestion or heartburn.   Yes [provider]  cholecalciferol (VITAMIN D3) 25 MCG (1000 UNIT) tablet Take 1,000 Units by mouth daily.   Yes [provider]  docusate sodium (COLACE) 100 MG capsule Take 100 mg by mouth daily as needed for mild constipation or moderate constipation.   Yes [provider]  ibuprofen (ADVIL) 200 MG tablet  Take 200 mg by mouth every 6 (six) hours as needed for fever, headache, mild pain or moderate pain.   Yes [provider]  latanoprost (XALATAN) 0.005 % ophthalmic solution 1 drop at bedtime. 04/23/21  Yes [provider]  LORazepam (ATIVAN) 0.5 MG tablet Take 0.25-0.5 mg by mouth 2 (two) times daily. 04/14/21  Yes [provider]  Multiple Vitamin (MULTIVITAMIN WITH MINERALS) TABS tablet Take 1 tablet by mouth daily.   Yes [provider]  polyethylene glycol (MIRALAX / GLYCOLAX) 17 g packet Take 17 g by mouth daily as needed for moderate constipation, severe constipation or mild constipation.   Yes [provider]  simethicone (MYLICON) 0000000 MG chewable tablet Chew 125 mg by mouth every 6 (six) hours as needed for flatulence.   Yes [provider]    Allergies    Amoxicillin-pot clavulanate, Atenolol, Donepezil hcl, Hydrochlorothiazide, Indapamide, Olmesartan, Pravastatin, and Sulfamethoxazole  Review of Systems   Review of Systems  All other systems reviewed and are negative.  Physical Exam Updated Vital Signs BP (!) 146/133   Pulse 98   Temp 99.8 F (37.7 C) (Rectal)   Resp (!) 26   Ht 6' (1.829 m)   Wt 91.6 kg   SpO2 100%   BMI 27.40 kg/m   Physical Exam Vitals and nursing note reviewed.  Constitutional:      Appearance: He is well-developed.     Comments: Lethargic  HENT:     Head: Normocephalic and atraumatic.  Cardiovascular:     Rate and Rhythm: Normal rate and regular rhythm.     Heart sounds: No murmur heard. Pulmonary:     Effort: Pulmonary effort is normal. No respiratory distress.     Breath sounds: Normal breath sounds.  Abdominal:     Palpations: Abdomen is soft.     Tenderness: There is no abdominal tenderness. There is no guarding or rebound.  Musculoskeletal:        General: No tenderness.     Comments: 1+ pitting edema to RLE  Skin:    General: Skin is warm and dry.     Coloration: Skin is pale.   Neurological:     Comments: Nonverbal. Weakly moves all four extremities to pain.  Psychiatric:     Comments: Unable to assess    ED Results / Procedures / Treatments   Labs (all labs ordered are listed, but only abnormal results are displayed) Labs Reviewed  LACTIC ACID, PLASMA - Abnormal; Notable for the following components:      Result Value   Lactic Acid, Venous 2.0 (*)    All other components within normal limits  LACTIC ACID, PLASMA - Abnormal; Notable for the following components:   Lactic Acid, Venous 4.9 (*)    All other components within normal limits  COMPREHENSIVE METABOLIC PANEL - Abnormal; Notable for the following components:   CO2 18 (*)    Glucose, Bld 113 (*)  BUN 85 (*)    Creatinine, Ser 4.02 (*)    AST 93 (*)    ALT 87 (*)    Total Bilirubin 1.4 (*)    GFR, Estimated 14 (*)    All other components within normal limits  CBC WITH DIFFERENTIAL/PLATELET - Abnormal; Notable for the following components:   WBC 12.1 (*)    RBC 3.88 (*)    Hemoglobin 12.5 (*)    HCT 38.1 (*)    Neutro Abs 10.0 (*)    Monocytes Absolute 1.2 (*)    Abs Immature Granulocytes 0.09 (*)    All other components within normal limits  PROTIME-INR - Abnormal; Notable for the following components:   Prothrombin Time 18.1 (*)    INR 1.5 (*)    All other components within normal limits  URINALYSIS, ROUTINE W REFLEX MICROSCOPIC - Abnormal; Notable for the following components:   Color, Urine AMBER (*)    APPearance HAZY (*)    Protein, ur 30 (*)    Leukocytes,Ua TRACE (*)    All other components within normal limits  BLOOD GAS, VENOUS - Abnormal; Notable for the following components:   pCO2, Ven 40.5 (*)    Bicarbonate 18.9 (*)    Acid-base deficit 6.8 (*)    All other components within normal limits  BRAIN NATRIURETIC PEPTIDE - Abnormal; Notable for the following components:   B Natriuretic Peptide 1,963.6 (*)    All other components within normal limits  I-STAT CHEM 8, ED -  Abnormal; Notable for the following components:   Chloride 112 (*)    BUN 82 (*)    Creatinine, Ser 4.50 (*)    Glucose, Bld 107 (*)    TCO2 20 (*)    All other components within normal limits  TROPONIN I (HIGH SENSITIVITY) - Abnormal; Notable for the following components:   Troponin I (High Sensitivity) 276 (*)    All other components within normal limits  TROPONIN I (HIGH SENSITIVITY) - Abnormal; Notable for the following components:   Troponin I (High Sensitivity) 286 (*)    All other components within normal limits  RESP PANEL BY RT-PCR (FLU A&B, COVID) ARPGX2  CULTURE, BLOOD (ROUTINE X 2)  CULTURE, BLOOD (ROUTINE X 2)  URINE CULTURE  C DIFFICILE QUICK SCREEN W PCR REFLEX    APTT  CK  HEPARIN LEVEL (UNFRACTIONATED)  BASIC METABOLIC PANEL  CBC  CBG MONITORING, ED  TROPONIN I (HIGH SENSITIVITY)    EKG EKG Interpretation  Date/Time:  Tuesday May 09 2021 01:04:13 EST Ventricular Rate:  143 PR Interval:    QRS Duration: 122 QT Interval:  327 QTC Calculation: 505 R Axis:   74 Text Interpretation: atrial fibrillation with RVR Nonspecific intraventricular conduction delay no prior available for comparison Confirmed by Quintella Reichert [phone]) on 05/14/2021 1:12:46 AM  Radiology CT Head Wo Contrast  Result Date: 04/26/2021 CLINICAL DATA:  Mental status change, unknown cause EXAM: CT HEAD WITHOUT CONTRAST TECHNIQUE: Contiguous axial images were obtained from the base of the skull through the vertex without intravenous contrast. COMPARISON:  None. FINDINGS: Brain: Age related atrophy. No intracranial hemorrhage, mass effect, or midline shift. No hydrocephalus. The basilar cisterns are patent. Mild periventricular chronic small vessel ischemia, normal for age. No evidence of territorial infarct or acute ischemia. No extra-axial or intracranial fluid collection. Vascular: No hyperdense vessel or unexpected calcification. Skull: No fracture or focal lesion. Sinuses/Orbits: No  acute finding. Occasional mucosal thickening of paranasal sinuses. No mastoid effusion. Other: None.  IMPRESSION: 1. No acute intracranial abnormality. 2. Age related atrophy and chronic small vessel ischemia. Electronically Signed   By: Narda Rutherford M.D.   On: 05/12/21 01:30   DG Chest Port 1 View  Result Date: May 12, 2021 CLINICAL DATA:  Possible sepsis, decreased blood pressure EXAM: PORTABLE CHEST 1 VIEW COMPARISON:  10/19/2020 FINDINGS: Cardiac shadow is enlarged. Tortuous thoracic aorta is noted. Central vascular congestion is noted with mild interstitial edema. Mild right basilar atelectasis is seen. No new bony abnormality is noted. IMPRESSION: Changes of mild CHF. Mild right basilar atelectasis is noted as well. Electronically Signed   By: Alcide Clever M.D.   On: 05/12/21 01:40    Procedures Procedures  CRITICAL CARE Performed by: Tilden Fossa   Total critical care time: 45 minutes  Critical care time was exclusive of separately billable procedures and treating other patients.  Critical care was necessary to treat or prevent imminent or life-threatening deterioration.  Critical care was time spent personally by me on the following activities: development of treatment plan with patient and/or surrogate as well as nursing, discussions with consultants, evaluation of patient's response to treatment, examination of patient, obtaining history from patient or surrogate, ordering and performing treatments and interventions, ordering and review of laboratory studies, ordering and review of radiographic studies, pulse oximetry and re-evaluation of patient's condition.  Medications Ordered in ED Medications  amiodarone (NEXTERONE PREMIX) 360-4.14 MG/200ML-% (1.8 mg/mL) IV infusion (60 mg/hr Intravenous New Bag/Given 2021/05/12 0214)  amiodarone (NEXTERONE PREMIX) 360-4.14 MG/200ML-% (1.8 mg/mL) IV infusion (has no administration in time range)  doxycycline (VIBRAMYCIN) 100 mg in sodium  chloride 0.9 % 250 mL IVPB (100 mg Intravenous New Bag/Given 2021-05-12 0342)  dexmedetomidine (PRECEDEX) 200 MCG/50ML (4 mcg/mL) infusion (0 mcg/kg/hr  91.6 kg Intravenous Paused 05/12/2021 0442)  latanoprost (XALATAN) 0.005 % ophthalmic solution 1 drop (has no administration in time range)  furosemide (LASIX) injection 40 mg (has no administration in time range)  sodium chloride flush (NS) 0.9 % injection 3 mL (has no administration in time range)  sodium chloride flush (NS) 0.9 % injection 3 mL (has no administration in time range)  0.9 %  sodium chloride infusion (has no administration in time range)  acetaminophen (TYLENOL) tablet 650 mg (has no administration in time range)    Or  acetaminophen (TYLENOL) suppository 650 mg (has no administration in time range)  bisacodyl (DULCOLAX) suppository 10 mg (has no administration in time range)  cefTRIAXone (ROCEPHIN) 1 g in sodium chloride 0.9 % 100 mL IVPB (has no administration in time range)  doxycycline (VIBRAMYCIN) 100 mg in sodium chloride 0.9 % 250 mL IVPB (has no administration in time range)  heparin ADULT infusion 100 units/mL (25000 units/236mL) (1,100 Units/hr Intravenous New Bag/Given 2021-05-12 0405)  cefTRIAXone (ROCEPHIN) 1 g in sodium chloride 0.9 % 100 mL IVPB (0 g Intravenous Stopped May 12, 2021 0301)  heparin bolus via infusion 4,000 Units (4,000 Units Intravenous Bolus from Bag 12-May-2021 0405)    ED Course  I have reviewed the triage vital signs and the nursing notes.  Pertinent labs & imaging results that were available during my care of the patient were reviewed by me and considered in my medical decision making (see chart for details).  Clinical Course as of May 12, 2021 0443  Tue 05-12-2021  0129 Pt altered, pulling at IV.  Unable to be redirected.  Soft restraints to the BUE ordered.  [HM]    Clinical Course User Index [HM] Muthersbaugh, Boyd Kerbs   MDM Rules/Calculators/A&P  patient with  history of hypertension, dementia here for evaluation of altered mental status, poor oral intake. On ED presentation patient minimally responsive but he did just receive sedating medications prior to arrival. EMS report that he was combative and verbal prior to ED arrival. He is found to be in a fib with RVR with blood pressures dropping to the 70s. Labs significant for elevation and BUN and creatinine, suspect that this is acute as patient appears of volume depleted. He was treated with IV fluid bolus as well as amiodarone for rate control. Chest x-ray with pulmonary vascular congestion, cannot rule out pneumonia. Will start on antibiotics for possible developing pneumonia. Patient did have improvement in his mental status during his ED stay. He did regain consciousness and became alert and verbal, moving all extremities symmetrically. He did have some agitation. He did not respond well to benzos and these were not given. He does have a prolonged QT on EKG and he is not a candidate for antipsychotics. Will start precedex for his agitation. Medicine consulted for admission for ongoing treatment.   Final Clinical Impression(s) / ED Diagnoses Final diagnoses:  Atrial fibrillation with rapid ventricular response ([place])  AKI (acute kidney injury) ([place])  Delirium    Rx / DC Orders ED Discharge Orders     None        Quintella Reichert, MD 05/09/2021 [phone]

## 2021-05-09 NOTE — Consult Note (Signed)
Consultation Note Date: 05/17/2021   Patient Name: Duane Woodward  DOB: Mar 23, 1941  MRN: [id number]  Age / Sex: 80 y.o., male  PCP: Shirline Frees, MD Referring Physician: Maryjane Hurter, MD  Reason for Consultation:   HPI/Patient Profile: 80 y.o. male  with past medical history of dementia, anxiety, GERD, HTN admitted on 04/20/2021 with increasing agitation. Workup has found likely cardiogenic shock with cardiomyopathy, EF approx 10%, ARF, pulmonary edema with BNP >1900, sepsis possible pnuemonia.     Primary Decision Maker HCPOA - daughter Hilda Blades  Discussion: Chart reviewed. Report received from Dr. Verlee Monte.  Met with patient's daughter, son, and son in law.   Life review:  Living in daughter's home prior to admission. Recently (about 2 months ago) separated from spouse. Known to be funny and independent. Last few months have been difficult for him as he has lost his independence and his cognitive status has declined.   Functional status: Prior to admission- able to ambulate in the home. Would sit in recliner most of day and sleep. Has not been eating for a few weeks.   Goals of care:  Family understands that patient has multiple life limiting comorbities acute and chronic. Surviving his current illness is likely to require prolonged hospitalization, possibly dialysis, invasive central line, prolonged antibiotics. He is not a candidate for invasive cardiac interventions. It is likely that if he were to survive he will be more debilitated than he was and dementia would be significantly worse. Family notes that patient did not consider his quality of life before admission to be sufficient and would not want it prolonged and definitely would not want it worsened. They agree to transition to full comfort measures only. However, would like to continue pressor support until tomorrow in efforts to keep him a  live long enough for daughter to settle some affairs. We discussed stopping interventions such as antibiotics, lab sticks. Starting gentle comfort measures- lorazepam for anxiety, agitation. Hydromorphone for possible pain that he cannot communicate to [place].  They are in agreement. Goal of care is to support him through natural dying process without suffering.   SUMMARY OF RECOMMENDATIONS -DNR -Stop IV antibiotics, labs, IV fluids -Continue pressors until tomorrow afternoon -Start prn IV lorazepam 21m q4hr for agitation -Start hydromorphone .[id number] prn for agitation or dyspnea -Family is aware he may decline despite continuing pressors -Plan to stop pressors and begin more aggressive comfort interventions tomorrow- would recommend scheduling hydromorphone once full comfort care path is initiated -If patient is stable after comfort care path initiated- then they would like to seek hospice bed- otherwise he will remain here for end of life care -PMT provider will f/u with family tomorrow and anticipate transition to complete comfort only at that time    Code Status/Advance Care Planning: DNR   Prognosis:   < 2 weeks  Discharge Planning: To Be Determined hospital death vs residential hospice  Primary Diagnoses: Present on Admission:  Cardiogenic shock ([facility] [place]  Review of Systems  Unable to  perform ROS: Mental status change   Physical Exam Vitals and nursing note reviewed.  Constitutional:      Appearance: He is ill-appearing.  Cardiovascular:     Rate and Rhythm: Tachycardia present.  Skin:    Coloration: Skin is pale.  Neurological:     Mental Status: He is disoriented.     Comments: Lethargic with intermittent agitation     Vital Signs: BP 112/73   Pulse (!) 129   Temp (!) 97.3 F (36.3 C) (Axillary)   Resp (!) 24   Ht 6' (1.829 m)   Wt 91.6 kg   SpO2 98%   BMI 27.40 kg/m  Pain Scale: Faces   Pain Score: Asleep   SpO2: SpO2: 98 % O2 Device:SpO2: 98 % O2 Flow  Rate: .O2 Flow Rate (L/min): 3 L/min  IO: Intake/output summary: No intake or output data in the 24 hours ending 04/25/2021 1601  LBM:   Baseline Weight: Weight: 91.6 kg Most recent weight: Weight: 91.6 kg      Thank you for this consult. Palliative medicine will continue to follow and assist as needed.   Time In: 1400 Time Out: 1622 Time Total: 140 minutes Greater than 50%  of this time was spent counseling and coordinating care related to the above assessment and plan.  Signed by: Mariana Kaufman, AGNP-C Palliative Medicine    Please contact Palliative Medicine Team phone at [phone] for questions and concerns.  For individual provider: See Shea Evans

## 2021-05-09 NOTE — Consult Note (Addendum)
Cardiology Consultation:   Patient ID: Duane Woodward MRN: [id number]; DOB: 1940-07-28  Admit date: 05/15/2021 Date of Consult: 04/18/2021  PCP:  Johny Blamer, MD   [facility] Providers Cardiologist:  New   Patient Profile:   Duane Woodward is a 80 y.o. male with a hx of HTN and presumed dementia who is being seen 05/06/2021 for the evaluation of afib RVR and elevated troponin at the request of Dr. Rhona Leavens.  History of Present Illness:   Mr. Gurry brought by EMS for AMS and aggressive behavior. Hx obtained from chart review and daughter at bedside. His wife left him after 37 years. Since then daughter taking care of him. Has noted diarrhea and depression. Sedentary life style. Seen by PCP Friday due to abdominal pain, nausea and diarrhea. His antihypertensive held due to soft blood pressure. Placed on PPI. Over weekend, he continued to have worsen AMS and confusion and EMS was called. He was combative and given 5mg  of IV versed. He was found to have afib RVR and started on IV amiodarone and IV heparin. Blood pressure is soft. CXR with possible RLL infiltrate.   Scr elevated at > 4. Hs-troponin 286>>272>>263 Lactic acid 2.0>>4.0 BNP 1963.6 Negative COVID and influenza  Dark brown urine Pending blood and urine culture CT of head without acute abnormality  Pending echo   Given Rocephin and doxycycline. On Levophed.   Past Medical History:  Diagnosis Date   Alzheimer's dementia (HCC)    Hypertension     History reviewed. No pertinent surgical history.   Inpatient Medications: Scheduled Meds:  furosemide  40 mg Intravenous Q12H   latanoprost  1 drop Both Eyes QHS   sodium chloride flush  3 mL Intravenous Q12H   Continuous Infusions:  sodium chloride Stopped (05/01/2021 05/11/21)   amiodarone 30 mg/hr (05/05/2021 0837)   [START ON Jun 01, 2021] cefTRIAXone (ROCEPHIN)  IV     doxycycline (VIBRAMYCIN) IV     heparin 1,100 Units/hr (05/04/2021 0405)   norepinephrine  (LEVOPHED) Adult infusion 9 mcg/min (05/13/2021 0901)   PRN Meds: sodium chloride, acetaminophen **OR** acetaminophen, bisacodyl, sodium chloride flush  Allergies:    Allergies  Allergen Reactions   Amoxicillin-Pot Clavulanate     Other reaction(s): Ulcers in mouth   Atenolol     Other reaction(s): Unknown   Donepezil Hcl     Other reaction(s): hallucinations   Hydrochlorothiazide     Other reaction(s): headache   Indapamide     Other reaction(s): headache   Olmesartan     Other reaction(s): Unknown   Pravastatin     Other reaction(s): nausea   Sulfamethoxazole     Other reaction(s): GI Upset (intolerance)    Social History:   Social History   Socioeconomic History   Marital status: Married    Spouse name: Not on file   Number of children: Not on file   Years of education: Not on file   Highest education level: Not on file  Occupational History   Not on file  Tobacco Use   Smoking status: Never   Smokeless tobacco: Never  Vaping Use   Vaping Use: Never used  Substance and Sexual Activity   Alcohol use: Not Currently   Drug use: Not Currently   Sexual activity: Not on file  Other Topics Concern   Not on file  Social History Narrative   Not on file   Social Determinants of Health   Financial Resource Strain: Not on file  Food Insecurity: Not on file  Transportation Needs: Not on file  Physical Activity: Not on file  Stress: Not on file  Social Connections: Not on file  Intimate Partner Violence: Not on file    Family History:   History reviewed. No pertinent family history.  Patient is currently unable to provide any hx due to AMS  ROS:  Please see the history of present illness.  All other ROS reviewed and negative.     Physical Exam/Data:   Vitals:   05/05/2021 1020 05/06/2021 1055 05/05/2021 1105 05/14/2021 1113  BP:  96/74  95/85  Pulse: (!) 57 (!) 50    Resp:  (!) 21 18 (!) 21  Temp:      TempSrc:      SpO2:  95%    Weight:      Height:        No intake or output data in the 24 hours ending 05/17/2021 1131 Last 3 Weights 05/15/2021  Weight (lbs) 202 lb  Weight (kg) 91.627 kg     Body mass index is 27.4 kg/m.  General:  Ill appearing elderly male in no acute distress HEENT: normal Neck: no JVD Vascular: No carotid bruits; Distal pulses 2+ bilaterally Cardiac:  normal S1, S2; RRR; no murmur  Lungs:  Clear on anterior auscultation  Abd: soft, nontender, no hepatomegaly  Ext: trace to 1 + edema Musculoskeletal:  No deformities, BUE and BLE strength normal and equal Skin: warm and dry  Neuro:  Lethargic Psych:  Lethargic   EKG:  The EKG was personally reviewed and demonstrates:  Atrial fibrillation  Telemetry:  Telemetry was personally reviewed and demonstrates:  afib 100s  Relevant CV Studies: As above   Laboratory Data:  High Sensitivity Troponin:   Recent Labs  Lab 05/16/2021 0047 05/14/2021 0225 05/06/2021 0515 04/24/2021 0700  TROPONINIHS 276* 286* 272* 263*     Chemistry Recent Labs  Lab 05/01/2021 0047 04/24/2021 0118 05/01/2021 0515  NA 140 142 140  K 4.6 5.0 4.8  CL 110 112* 112*  CO2 18*  --  15*  GLUCOSE 113* 107* 119*  BUN 85* 82* 83*  CREATININE 4.02* 4.50* 4.30*  CALCIUM 9.0  --  8.5*  GFRNONAA 14*  --  13*  ANIONGAP 12  --  13    Recent Labs  Lab 04/26/2021 0047  PROT 6.6  ALBUMIN 3.9  AST 93*  ALT 87*  ALKPHOS 74  BILITOT 1.4*   Hematology Recent Labs  Lab 04/30/2021 0047 05/12/2021 0118 05/11/2021 0515  WBC 12.1*  --  12.8*  RBC 3.88*  --  3.56*  HGB 12.5* 13.6 11.4*  HCT 38.1* 40.0 35.4*  MCV 98.2  --  99.4  MCH 32.2  --  32.0  MCHC 32.8  --  32.2  RDW 13.7  --  13.8  PLT 172  --  161   BNP Recent Labs  Lab 05/15/2021 0047  BNP 1,963.6*    Radiology/Studies:  CT Head Wo Contrast  Result Date: 05/09/2021 CLINICAL DATA:  Mental status change, unknown cause EXAM: CT HEAD WITHOUT CONTRAST TECHNIQUE: Contiguous axial images were obtained from the base of the skull through the  vertex without intravenous contrast. COMPARISON:  None. FINDINGS: Brain: Age related atrophy. No intracranial hemorrhage, mass effect, or midline shift. No hydrocephalus. The basilar cisterns are patent. Mild periventricular chronic small vessel ischemia, normal for age. No evidence of territorial infarct or acute ischemia. No extra-axial or intracranial fluid collection. Vascular: No hyperdense vessel or unexpected calcification. Skull: No fracture  or focal lesion. Sinuses/Orbits: No acute finding. Occasional mucosal thickening of paranasal sinuses. No mastoid effusion. Other: None. IMPRESSION: 1. No acute intracranial abnormality. 2. Age related atrophy and chronic small vessel ischemia. Electronically Signed   By: Keith Rake M.D.   On: 05/02/2021 01:30   DG Chest Port 1 View  Result Date: 04/23/2021 CLINICAL DATA:  Possible sepsis, decreased blood pressure EXAM: PORTABLE CHEST 1 VIEW COMPARISON:  10/19/2020 FINDINGS: Cardiac shadow is enlarged. Tortuous thoracic aorta is noted. Central vascular congestion is noted with mild interstitial edema. Mild right basilar atelectasis is seen. No new bony abnormality is noted. IMPRESSION: Changes of mild CHF. Mild right basilar atelectasis is noted as well. Electronically Signed   By: Inez Catalina M.D.   On: 05/06/2021 01:40     Assessment and Plan:   Atrial fibrillation with RVR - New onset in setting of underlying pneumonia, chronic diarrhea and sepsis presentation  - HR improving on Amiodarone - BP soft - On heparin for anticoagulation - Pending echo  - Check TSH  2. Elevated troponin  - Hs-troponin 286>>272>>263 - No reported CP or SOB per daughter - On heparin for # 2 - Likely due to demand ischemia from CAP, sepsis presentation (lactic acid worsen to 4.9) and AKI - Not a candidate for ischemic eval currently>> pending echo   3. CAP  - Antibiotics per primary team   4. ARF - Per primary team   5. ? CHF - BNP ~2000 - LE edema on  exam  - CXr with mild CHF - Pending echo - Lasix order written but held given soft BP, on Levo  6. Diarrhea - Per primary   Risk Assessment/Risk Scores:   TIMI Risk Score for Unstable Angina or Non-ST Elevation MI:   The patient's TIMI risk score is 2, which indicates a 8% risk of all cause mortality, new or recurrent myocardial infarction or need for urgent revascularization in the next 14 days.{    CHA2DS2-VASc Score = 3  {This indicates a 3.2% annual risk of stroke. The patient's score is based upon: CHF History: 0 HTN History: 1 Diabetes History: 0 Stroke History: 0 Vascular Disease History: 0 Age Score: 2 Gender Score: 0   Score would be 4 if CHF.   For questions or updates, please contact [place] Please consult [email or web address] for contact info under  Signed, Leanor Kail, PA  04/20/2021 11:31 AM  As above, patient seen and examined, 80 year old male with past medical history of hypertension and dementia for evaluation of atrial fibrillation with rapid ventricular response and elevated troponin.  Patient is confused and history is obtained from the chart and his daughter.  Apparently his wife left him recently after marriage for 37 years.  Patient has been depressed per his daughter and not eating.  He has developed diarrhea and some nausea as well as abdominal pain.  Over the weekend he became confused with altered mental status.  She therefore brought him to the emergency room for further evaluation.  He is noted to be in atrial fibrillation and troponin mildly elevated and cardiology asked to evaluate. Patient is not volume overloaded on examination.  He is confused.  Creatinine is 4.3 and BUN 83.  Troponin high 276, 286, 272 and 263.  Lactic acid 4.9, white blood cell count 12.8 with hemoglobin 11.4.  Electrocardiogram shows atrial fibrillation with rapid ventricular response, PVCs or aberrantly conducted beats, cannot rule out septal infarct and nonspecific ST  changes.  1 atrial fibrillation  with rapid ventricular response-patient was initially hypotensive.  We will control heart rate with amiodarone intravenously.  I agree with IV heparin.  We will arrange echocardiogram to assess LV function.  Check TSH.  2 elevated troponin-minimal elevation in the setting of renal insufficiency not diagnostic of acute coronary syndrome.  No plans for further ischemia evaluation particular in light of his other comorbidities.  3 acute kidney injury-we do not have a baseline creatinine but renal function significantly depressed.  I do not think he appears to be volume overloaded on examination but actually likely dry particular with history of decreased p.o. intake.  Would not diurese further.  4 question pneumonia-antibiotics per primary care.  5 dementia  Patient's daughter apparently does not want intubation. Will follow Kirk Ruths, MD

## 2021-05-10 LAB — BLOOD CULTURE ID PANEL (REFLEXED) - BCID2

## 2021-05-10 LAB — GLUCOSE, CAPILLARY: Glucose-Capillary: 173 mg/dL — ABNORMAL HIGH (ref 70–99)

## 2021-05-10 MED ORDER — LORAZEPAM 2 MG/ML IJ SOLN
1.0000 mg | INTRAMUSCULAR | Status: DC | PRN
  Administered 2021-05-10: 1 mg via INTRAVENOUS
  Filled 2021-05-10: qty 1

## 2021-05-10 MED ORDER — DIPHENHYDRAMINE HCL 50 MG/ML IJ SOLN: 25.0000 mg | INTRAMUSCULAR | Status: DC | PRN

## 2021-05-10 MED ORDER — DEXTROSE 5 % IV SOLN: INTRAVENOUS | Status: DC

## 2021-05-10 MED ORDER — GLYCOPYRROLATE 0.2 MG/ML IJ SOLN: 0.2000 mg | INTRAMUSCULAR | Status: DC | PRN

## 2021-05-10 MED ORDER — ACETAMINOPHEN 325 MG PO TABS: 650.0000 mg | ORAL_TABLET | Freq: Four times a day (QID) | ORAL | Status: DC | PRN

## 2021-05-10 MED ORDER — ACETAMINOPHEN 650 MG RE SUPP: 650.0000 mg | Freq: Four times a day (QID) | RECTAL | Status: DC | PRN

## 2021-05-10 MED ORDER — POLYVINYL ALCOHOL 1.4 % OP SOLN
1.0000 [drp] | Freq: Four times a day (QID) | OPHTHALMIC | Status: DC | PRN
  Filled 2021-05-10: qty 15

## 2021-05-10 MED ORDER — GLYCOPYRROLATE 1 MG PO TABS: 1.0000 mg | ORAL_TABLET | ORAL | Status: DC | PRN

## 2021-05-10 MED ORDER — MICONAZOLE NITRATE POWD: Freq: Two times a day (BID) | Status: DC

## 2021-05-10 MED ORDER — HYDROMORPHONE HCL 1 MG/ML IJ SOLN
0.5000 mg | INTRAMUSCULAR | Status: DC | PRN
Start: 2021-05-10 — End: 2021-05-11
  Administered 2021-05-10 (×2): 1 mg via INTRAVENOUS
  Filled 2021-05-10 (×2): qty 1

## 2021-05-11 NOTE — Progress Notes (Signed)
This pt has a legal spouse Iven Earnhart). She and the pt have been separated 8 weeks prior to the pt passing away.  Per pt placement, Clydie Braun (spouse) would have to verbally relinquish responsibility of funeral preparations. This RN called Clydie Braun at [phone] on 05/01/21.  Mrs. Shear verbally relinquished responsibility for all decision making regarding funeral preparations to the pt's 2 kids. Baxter Kail [phone]) and Sharin Mons [phone]). Her request was heard by 2 separate RNs (Myself & Senaida Ores, RN).  This request is reflected in the post-mortem flowsheet

## 2021-05-11 NOTE — Progress Notes (Signed)
Patient time of death occurred at on 05-13-2021 witnessed by 2RN Hughes Better, RN and Denny Peon, RN. Informed [facility] on call. Daughter at bedside at time of death.

## 2021-05-12 LAB — CULTURE, BLOOD (ROUTINE X 2): Special Requests: ADEQUATE

## 2021-05-14 LAB — CULTURE, BLOOD (ROUTINE X 2)
Culture: NO GROWTH
Special Requests: ADEQUATE

## 2021-05-18 NOTE — Progress Notes (Signed)
Chaplain engaged in prayer over [place] also known as [employer], earlier today at request of family.  Chaplain was later paged when Duane Woodward's daughter arrived and provided support to her.  She voiced that her dad had not been happy for awhile now and just didn't want to be alone.  She described Annette Stable as being a good dad who became their primary parent when she was around 80 years old.  As a single dad, she voiced he did a great job.  She also shared that Annette Stable was very spiritual and loved going to church.  He really valued his relationship with God. Chaplain prayed with Duane Woodward's daughter at his bedside.  She expressed that [employer] loves gospel music.  Chaplain let nurse know who was able to get music playing in the room for them.   Chaplain offered listening, presence and support.     05/09/2021 1100  Clinical Encounter Type  Visited With Patient and family together  Visit Type Spiritual support  Spiritual Encounters  Spiritual Needs Prayer

## 2021-05-18 NOTE — Progress Notes (Signed)
         OVERNIGHT PROGRESS REPORT    Notified CCM (E-link) on behalf of Pharmacy in reference to [employer].    Chinita Greenland MSNA MSN ACNPC-AG Acute Care Nurse Practitioner [facility]

## 2021-05-18 NOTE — Discharge Summary (Signed)
DEATH SUMMARY   Patient Details  Name: Duane Woodward MRN: [id number] DOB: [birth year]  Admission/Discharge Information   Admit Date:  05-27-2021  Date of Death: Date of Death: 05-28-21  Time of Death: Time of Death: 10-18-2337  Length of Stay: 1  Referring Physician: Johny Blamer, MD   Reason(s) for Hospitalization  Cardiogenic shock Acute toxic metabolic encephalopathy Acute renal failure Atrial fibrillation and rapid ventricular response  Diagnoses  Preliminary cause of death:  Secondary Diagnoses (including complications and co-morbidities):  Principal Problem:   NSTEMI (non-ST elevated myocardial infarction) [facility]) Active Problems:   Acute CHF (congestive heart failure) (HCC)   ARF (acute renal failure) (HCC)   CAP (community acquired pneumonia)   Dementia (HCC)   Atrial fibrillation with RVR (HCC)   Prolonged QT interval   Cardiogenic shock (HCC)   AKI (acute kidney injury) (HCC)   Palliative care by specialist   Advanced care planning/counseling discussion   Brief Hospital Course (including significant findings, care, treatment, and services provided and events leading to death)  Duane Woodward is a 80 y.o. year old male who was admitted after daughter had noticed recent increasing confusion and aggressive behavior. His PO intake was poor and he'd also had quite a bit of diarrhea over last 6 months. In ED he was found to have AF/RVR, given 1.5L saline, ABX for CAP coverage and started on norepinephrine for shock. PCCM consulted and on my evaluation in ED bedside US with EF <20%, severely depressed, extremities cool, favoring cardiogenic shock. After discussion with family regarding the support he'd likely need to safely recover (intubation, central line placement, strong likelihood of dialysis) family decided to prioritize his comfort as they felt he wouldn't want even time-limited trial of these measures. He was pronounced dead on 05-28-2021.    Pertinent Labs and  Studies  Significant Diagnostic Studies CT Head Wo Contrast  Result Date: 05/27/21 CLINICAL DATA:  Mental status change, unknown cause EXAM: CT HEAD WITHOUT CONTRAST TECHNIQUE: Contiguous axial images were obtained from the base of the skull through the vertex without intravenous contrast. COMPARISON:  None. FINDINGS: Brain: Age related atrophy. No intracranial hemorrhage, mass effect, or midline shift. No hydrocephalus. The basilar cisterns are patent. Mild periventricular chronic small vessel ischemia, normal for age. No evidence of territorial infarct or acute ischemia. No extra-axial or intracranial fluid collection. Vascular: No hyperdense vessel or unexpected calcification. Skull: No fracture or focal lesion. Sinuses/Orbits: No acute finding. Occasional mucosal thickening of paranasal sinuses. No mastoid effusion. Other: None. IMPRESSION: 1. No acute intracranial abnormality. 2. Age related atrophy and chronic small vessel ischemia. Electronically Signed   By: Narda Rutherford M.D.   On: 05-27-21 01:30   DG Chest Port 1 View  Result Date: 05-27-2021 CLINICAL DATA:  Possible sepsis, decreased blood pressure EXAM: PORTABLE CHEST 1 VIEW COMPARISON:  10/19/2020 FINDINGS: Cardiac shadow is enlarged. Tortuous thoracic aorta is noted. Central vascular congestion is noted with mild interstitial edema. Mild right basilar atelectasis is seen. No new bony abnormality is noted. IMPRESSION: Changes of mild CHF. Mild right basilar atelectasis is noted as well. Electronically Signed   By: Alcide Clever M.D.   On: 27-May-2021 01:40    Microbiology Recent Results (from the past 240 hour(s))  Urine Culture     Status: None   Collection Time: 2021-05-27 12:42 AM   Specimen: In/Out Cath Urine  Result Value Ref Range Status   Specimen Description   Final    IN/OUT CATH URINE Performed at [facility], [address] [place], [place] [address]    Special Requests   Final    NONE Performed  at [facility], [address], [place], [place] [address]    Culture   Final    NO GROWTH Performed at [facility], [address], [place], [place] [address]    Report Status 04/26/2021 FINAL  Final  Resp Panel by RT-PCR (Flu A&B, Covid) Nasopharyngeal Swab     Status: None   Collection Time: 05/12/2021 12:43 AM   Specimen: Nasopharyngeal Swab; Nasopharyngeal(NP) swabs in vial transport medium  Result Value Ref Range Status   SARS Coronavirus 2 by RT PCR NEGATIVE NEGATIVE Final    Comment: (NOTE) SARS-CoV-2 target nucleic acids are NOT DETECTED.  The SARS-CoV-2 RNA is generally detectable in upper respiratory specimens during the acute phase of infection. The lowest concentration of SARS-CoV-2 viral copies this assay can detect is 138 copies/mL. A negative result does not preclude SARS-Cov-2 infection and should not be used as the sole basis for treatment or other patient management decisions. A negative result may occur with  improper specimen collection/handling, submission of specimen other than nasopharyngeal swab, presence of viral mutation(s) within the areas targeted by this assay, and inadequate number of viral copies(<138 copies/mL). A negative result must be combined with clinical observations, patient history, and epidemiological information. The expected result is Negative.  Fact Sheet for Patients:  BloggerCourse.com  Fact Sheet for Healthcare Providers:  SeriousBroker.it  This test is no t yet approved or cleared by the [place] FDA and  has been authorized for detection and/or diagnosis of SARS-CoV-2 by FDA under an Emergency Use Authorization (EUA). This EUA will remain  in effect (meaning this test can be used) for the duration of the COVID-19 declaration under Section 564(b)(1) of the Act, 21 U.S.C.section 360bbb-3(b)(1), unless the authorization is terminated  or revoked sooner.        Influenza A by PCR NEGATIVE NEGATIVE Final   Influenza B by PCR NEGATIVE NEGATIVE Final    Comment: (NOTE) The Xpert Xpress SARS-CoV-2/FLU/RSV plus assay is intended as an aid in the diagnosis of influenza from Nasopharyngeal swab specimens and should not be used as a sole basis for treatment. Nasal washings and aspirates are unacceptable for Xpert Xpress SARS-CoV-2/FLU/RSV testing.  Fact Sheet for Patients: BloggerCourse.com  Fact Sheet for Healthcare Providers: SeriousBroker.it  This test is not yet approved or cleared by the [place] FDA and has been authorized for detection and/or diagnosis of SARS-CoV-2 by FDA under an Emergency Use Authorization (EUA). This EUA will remain in effect (meaning this test can be used) for the duration of the COVID-19 declaration under Section 564(b)(1) of the Act, 21 U.S.C. section 360bbb-3(b)(1), unless the authorization is terminated or revoked.  Performed at [facility], [address] [place], [place] [address]   Blood Culture (routine x 2)     Status: None   Collection Time: 05/03/2021 12:47 AM   Specimen: BLOOD  Result Value Ref Range Status   Specimen Description   Final    BLOOD BLOOD RIGHT FOREARM Performed at [facility], [address], [place], [place] [address]    Special Requests   Final    BOTTLES DRAWN AEROBIC AND ANAEROBIC Blood Culture adequate volume Performed at [facility], [address], [place], [place] [address]    Culture   Final    NO GROWTH 5 DAYS Performed at  [facility], [address], [place], [place] [address]    Report Status 05/14/2021 FINAL  Final  Blood Culture (routine x 2)     Status: Abnormal   Collection Time: 2021-05-17 12:47 AM   Specimen: BLOOD  Result Value Ref Range Status   Specimen Description   Final    BLOOD RIGHT ANTECUBITAL Performed at [facility], [address], [place], [place] [address]    Special Requests   Final    BOTTLES DRAWN AEROBIC AND ANAEROBIC Blood Culture adequate volume Performed at [facility], [address] [place], [place] [address]    Culture  Setup Time   Final    GRAM POSITIVE COCCI AEROBIC BOTTLE ONLY CRITICAL RESULT CALLED TO, READ BACK BY AND VERIFIED WITH: PHARMD MICHELLE L. 04/29/2021@1 :40 BY TW    Culture (A)  Final    STAPHYLOCOCCUS SCHLEIFERI THE SIGNIFICANCE OF ISOLATING THIS ORGANISM FROM A SINGLE SET OF BLOOD CULTURES WHEN MULTIPLE SETS ARE DRAWN IS UNCERTAIN. PLEASE NOTIFY THE MICROBIOLOGY DEPARTMENT WITHIN ONE WEEK IF SPECIATION AND SENSITIVITIES ARE REQUIRED. Performed at [facility], [address] [place], [place] [address]    Report Status 05/12/2021 FINAL  Final  Blood Culture ID Panel (Reflexed)     Status: Abnormal   Collection Time: 05-17-21 12:47 AM  Result Value Ref Range Status   Enterococcus faecalis NOT DETECTED NOT DETECTED Final   Enterococcus Faecium NOT DETECTED NOT DETECTED Final   Listeria monocytogenes NOT DETECTED NOT DETECTED Final   Staphylococcus species DETECTED (A) NOT DETECTED Final    Comment: CRITICAL RESULT CALLED TO, READ BACK BY AND VERIFIED WITH: PHARMD MICHELLE L. 05/11/2021@1 :40 BY TW    Staphylococcus aureus (BCID) NOT DETECTED NOT DETECTED Final   Staphylococcus epidermidis NOT DETECTED NOT DETECTED Final   Staphylococcus lugdunensis NOT DETECTED NOT DETECTED Final   Streptococcus species NOT DETECTED NOT DETECTED Final   Streptococcus agalactiae NOT DETECTED NOT DETECTED Final   Streptococcus pneumoniae NOT DETECTED NOT DETECTED Final   Streptococcus pyogenes NOT DETECTED NOT DETECTED Final   A.calcoaceticus-baumannii NOT DETECTED NOT DETECTED Final   Bacteroides fragilis NOT DETECTED NOT DETECTED Final   Enterobacterales NOT DETECTED NOT DETECTED Final   Enterobacter cloacae complex NOT DETECTED NOT  DETECTED Final   Escherichia coli NOT DETECTED NOT DETECTED Final   Klebsiella aerogenes NOT DETECTED NOT DETECTED Final   Klebsiella oxytoca NOT DETECTED NOT DETECTED Final   Klebsiella pneumoniae NOT DETECTED NOT DETECTED Final   Proteus species NOT DETECTED NOT DETECTED Final   Salmonella species NOT DETECTED NOT DETECTED Final   Serratia marcescens NOT DETECTED NOT DETECTED Final   Haemophilus influenzae NOT DETECTED NOT DETECTED Final   Neisseria meningitidis NOT DETECTED NOT DETECTED Final   Pseudomonas aeruginosa NOT DETECTED NOT DETECTED Final   Stenotrophomonas maltophilia NOT DETECTED NOT DETECTED Final   Candida albicans NOT DETECTED NOT DETECTED Final   Candida auris NOT DETECTED NOT DETECTED Final   Candida glabrata NOT DETECTED NOT DETECTED Final   Candida krusei NOT DETECTED NOT DETECTED Final   Candida parapsilosis NOT DETECTED NOT DETECTED Final   Candida tropicalis NOT DETECTED NOT DETECTED Final   Cryptococcus neoformans/gattii NOT DETECTED NOT DETECTED Final    Comment: Performed at [facility], [address], [place], [place] [address]  MRSA Next Gen by PCR, Nasal     Status: None   Collection Time: 05-17-2021  1:19 PM   Specimen: Nasal Mucosa; Nasal  Swab  Result Value Ref Range Status   MRSA by PCR Next Gen NOT DETECTED NOT DETECTED Final    Comment: (NOTE) The GeneXpert MRSA Assay (FDA approved for NASAL specimens only), is one component of a comprehensive MRSA colonization surveillance program. It is not intended to diagnose MRSA infection nor to guide or monitor treatment for MRSA infections. Test performance is not FDA approved in patients less than 81 years old. Performed at [facility], [address], [place], [place] [address]     Lab Basic Metabolic Panel: Recent Labs  Lab 2021/05/19 0047 2021/05/19 0118 19-May-2021 0515 May 19, 2021 1203  NA 140 142 140  --   K 4.6 5.0 4.8  --   CL 110 112* 112*  --   CO2 18*  --  15*  --    GLUCOSE 113* 107* 119*  --   BUN 85* 82* 83*  --   CREATININE 4.02* 4.50* 4.30* 4.06*  CALCIUM 9.0  --  8.5*  --    Liver Function Tests: Recent Labs  Lab 05/19/2021 0047  AST 93*  ALT 87*  ALKPHOS 74  BILITOT 1.4*  PROT 6.6  ALBUMIN 3.9   No results for input(s): LIPASE, AMYLASE in the last 168 hours. No results for input(s): AMMONIA in the last 168 hours. CBC: Recent Labs  Lab 2021/05/19 0047 2021-05-19 0118 05/19/2021 0515 2021-05-19 1203  WBC 12.1*  --  12.8* 17.5*  NEUTROABS 10.0*  --   --   --   HGB 12.5* 13.6 11.4* 11.6*  HCT 38.1* 40.0 35.4* 37.2*  MCV 98.2  --  99.4 101.6*  PLT 172  --  161 181   Cardiac Enzymes: Recent Labs  Lab 19-May-2021 0225  CKTOTAL 211   Sepsis Labs: Recent Labs  Lab 05-19-2021 0047 05/19/21 0225 05/19/2021 0515 05-19-21 1203  WBC 12.1*  --  12.8* 17.5*  LATICACIDVEN 2.0* 4.9*  --   --     Procedures/Operations  None   Omar Person 05/15/2021, 7:36 PM

## 2021-05-18 NOTE — Progress Notes (Signed)
Nutrition Brief Note  Patient screened for MST of 3. Chart reviewed. Patient now transitioning to comfort care.  No further nutrition interventions planned at this time. Please re-consult as needed.       Trenton Gammon, MS, RD, LDN, CNSC Inpatient Clinical Dietitian RD pager # available in [facility]  After hours/weekend pager # available in [facility]

## 2021-05-18 NOTE — Progress Notes (Signed)
Chaplain received a page to come and support family who was requesting prayer.  Chaplain prayed with family and remained to offer support as they made decisions about whether to transition their dad to nasal canula.  They were having a difficult time making the decision and chaplain pointed out that their decision came from a place of love, no matter what they decided and reminded them that their dad was in many ways making the decision because his respiration rate and blood pressure were both decreasing significantly.  They were grateful for the support.  Chaplain Dyanne Carrel, Bcc Pager, [phone] 5:36 PM

## 2021-05-18 NOTE — Progress Notes (Signed)
   NAME:  Duane Woodward, MRN:  [id number], DOB:  [birth year], LOS: 1 ADMISSION DATE:  04/19/2021, CONSULTATION DATE:  04/26/2021 REFERRING MD:  Rhona Leavens, CHIEF COMPLAINT:  confusion, agitation   History of Present Illness:  80yM with history of HTN, dementia who was BIBEMS for increasing confusion and aggressive behavior. Living with daughter - prior to 6 weeks ago he was able to take care of most ADLs including finances but has gradually had more and more limitation, over last week has difficulty with ambulation, dressing, feeding independently. Diarrhea over last 6 weeks, recent low blood pressure taken off of antihypertensives, hemoccult positive at PCP office last week but no frankly bloody stools, little PO intake. With EMS he was given 5mg  IM versed for agitation prior to arrival.  In ED found to have AF/RVR, started on amiodarone, hypotension given 500cc saline, started on precedex for agitation and then levophed gtt for hypotension, given ceftriaxone and doxycycline with concern for pneumonia. Given ativan 1mg  twice for agitation.  Pertinent  Medical History  HTN Alzheimer Dementia  Significant Hospital Events: Including procedures, antibiotic start and stop dates in addition to other pertinent events   11/22 admitted, started on amio for AF/RVR, levo for cardiogenic shock, intermittent ativan for agitation  Interim History / Subjective:    Objective   Blood pressure (!) 87/67, pulse 91, temperature (!) 97.1 F (36.2 C), temperature source Axillary, resp. rate 15, height 6' (1.829 m), weight 96.5 kg, SpO2 99 %.        Intake/Output Summary (Last 24 hours) at 14-May-2021 1018 Last data filed at 05/14/21 1003 Gross per 24 hour  Intake 1397.45 ml  Output 140 ml  Net 1257.45 ml   Filed Weights   04/22/2021 0150 14-May-2021 0500  Weight: 91.6 kg 96.5 kg    Examination: General appearance: 80 y.o., male, drowsy, eyes closed HENT: NCAT; dry MM Lungs:diminished bilaterally, normal  WOB CV: tachycardic IRIR, no murmur Abdomen: Soft, non-tender; non-distended, BS present  Extremities: 2+ ble edema, cool  No new labs, imaging  Resolved Hospital Problem list     Assessment & Plan:   # Acute toxic metabolic encephalopathy on dementia # Mixed shock # Troponinemia - demand # AKI vs AKI on CKD # [place] metabolic acidosis # Congestive hepatopathy # AF and RVR  - once son arrives later today will discontinue levo and implement comfort measures only - Appreciate palliative care's assistance with guiding this transition     Best Practice (right click and "Reselect all SmartList Selections" daily)   Diet/type: NPO DVT prophylaxis: systemic heparin - hold GI prophylaxis: N/A Lines: N/A Foley:  Yes, and it is still needed Code Status:  DNR Last date of multidisciplinary goals of care discussion: 11/23 - DNR and transition to comfort when son arrives later today  Critical care time: 20 minutes

## 2021-05-18 NOTE — Progress Notes (Signed)
PHARMACY - PHYSICIAN COMMUNICATION CRITICAL VALUE ALERT - BLOOD CULTURE IDENTIFICATION (BCID)  Duane Woodward is an 80 y.o. male who presented to [facility] on 05/06/2021 with a chief complaint of increased confusion & agitation.   Assessment:   Patient is currently comfort care- antibiotics for PNA stopped yesterday.    Aerobic bottle of 1 blood cx set + GPC; BCID + staph species, no resistance. Likely contaminant.   Name of physician (or Provider) Contacted: Johann Capers  Current antibiotics: none  Changes to prescribed antibiotics recommended:  Observe off antibiotics  Results for orders placed or performed during the hospital encounter of 05/08/2021  Blood Culture ID Panel (Reflexed) (Collected: 04/21/2021 12:47 AM)  Result Value Ref Range   Enterococcus faecalis NOT DETECTED NOT DETECTED   Enterococcus Faecium NOT DETECTED NOT DETECTED   Listeria monocytogenes NOT DETECTED NOT DETECTED   Staphylococcus species DETECTED (A) NOT DETECTED   Staphylococcus aureus (BCID) NOT DETECTED NOT DETECTED   Staphylococcus epidermidis NOT DETECTED NOT DETECTED   Staphylococcus lugdunensis NOT DETECTED NOT DETECTED   Streptococcus species NOT DETECTED NOT DETECTED   Streptococcus agalactiae NOT DETECTED NOT DETECTED   Streptococcus pneumoniae NOT DETECTED NOT DETECTED   Streptococcus pyogenes NOT DETECTED NOT DETECTED   A.calcoaceticus-baumannii NOT DETECTED NOT DETECTED   Bacteroides fragilis NOT DETECTED NOT DETECTED   Enterobacterales NOT DETECTED NOT DETECTED   Enterobacter cloacae complex NOT DETECTED NOT DETECTED   Escherichia coli NOT DETECTED NOT DETECTED   Klebsiella aerogenes NOT DETECTED NOT DETECTED   Klebsiella oxytoca NOT DETECTED NOT DETECTED   Klebsiella pneumoniae NOT DETECTED NOT DETECTED   Proteus species NOT DETECTED NOT DETECTED   Salmonella species NOT DETECTED NOT DETECTED   Serratia marcescens NOT DETECTED NOT DETECTED   Haemophilus influenzae NOT DETECTED NOT  DETECTED   Neisseria meningitidis NOT DETECTED NOT DETECTED   Pseudomonas aeruginosa NOT DETECTED NOT DETECTED   Stenotrophomonas maltophilia NOT DETECTED NOT DETECTED   Candida albicans NOT DETECTED NOT DETECTED   Candida auris NOT DETECTED NOT DETECTED   Candida glabrata NOT DETECTED NOT DETECTED   Candida krusei NOT DETECTED NOT DETECTED   Candida parapsilosis NOT DETECTED NOT DETECTED   Candida tropicalis NOT DETECTED NOT DETECTED   Cryptococcus neoformans/gattii NOT DETECTED NOT DETECTED    Junita Push PharmD 2021-06-05  1:53 AM

## 2021-05-18 NOTE — Progress Notes (Signed)
Son Jomarie Longs called, gave update. Oliver Barre, RN

## 2021-05-18 NOTE — Progress Notes (Signed)
Daily Progress Note   Patient Name: Duane Woodward       Date: 05/08/2021 DOB: [birth year]  Age: 80 y.o. MRN#: [id number] Attending Physician: Omar Person, MD Primary Care Physician: Johny Blamer, MD Admit Date: 06-03-21  Reason for Consultation/Follow-up: Establishing goals of care  Subjective:  Not awake not alert, moans at times, using accessory muscles of respiration, is on NRB mask, daughter Stanton Kidney arrived at bedside and re discussed goals of care,see below.   Length of Stay: 1  Current Medications: Scheduled Meds:   chlorhexidine  15 mL Mouth Rinse BID   Chlorhexidine Gluconate Cloth  6 each Topical Daily   latanoprost  1 drop Both Eyes QHS   mouth rinse  15 mL Mouth Rinse q12n4p   miconazole   Topical BID   miconazole nitrate   Topical BID   sodium chloride flush  3 mL Intravenous Q12H    Continuous Infusions:  sodium chloride Stopped (06-03-2021 0613)   sodium chloride     amiodarone 30 mg/hr (04/22/2021 0800)   norepinephrine (LEVOPHED) Adult infusion 10 mcg/min (04/20/2021 0800)    PRN Meds: sodium chloride, acetaminophen **OR** acetaminophen, bisacodyl, docusate sodium, HYDROmorphone (DILAUDID) injection, LORazepam, polyethylene glycol, sodium chloride flush  Physical Exam         Appears in mild distress On NRB mask Has edema LE Not awake not alert Monitor noted Tachycardic  Vital Signs: BP (!) 87/67   Pulse 91   Temp (!) 97.1 F (36.2 C) (Axillary)   Resp 15   Ht 6' (1.829 m)   Wt 96.5 kg   SpO2 99%   BMI 28.85 kg/m  SpO2: SpO2: 99 % O2 Device: O2 Device: NRB O2 Flow Rate: O2 Flow Rate (L/min): 15 L/min  Intake/output summary:  Intake/Output Summary (Last 24 hours) at 04/27/2021 1132 Last data filed at 05/01/2021 1003 Gross per 24 hour   Intake 1326.73 ml  Output 140 ml  Net 1186.73 ml   LBM: Last BM Date:  (pta) Baseline Weight: Weight: 91.6 kg Most recent weight: Weight: 96.5 kg       Palliative Assessment/Data:      Patient Active Problem List   Diagnosis Date Noted   NSTEMI (non-ST elevated myocardial infarction) (HCC) 06-03-2021   Acute CHF (congestive heart failure) (HCC) 2021/06/03   ARF (acute renal  failure) (HCC) 05/14/2021   CAP (community acquired pneumonia) 04/19/2021   Dementia (HCC) 04/26/2021   Atrial fibrillation with RVR (HCC) 05/05/2021   Prolonged QT interval 05/11/2021   Cardiogenic shock (HCC) 05/02/2021   AKI (acute kidney injury) [facility])    Palliative care by specialist    Advanced care planning/counseling discussion     Palliative Care Assessment & Plan   Patient Profile:    Assessment:  80 yo gentleman with Acute toxic metabolic encephalopathy on dementia, AKI, possible CKD, A fib with RVR, and  Mixed shock, admitted to ICU, PMT following for goals of care discussions.     Recommendations/Plan: Family meeting with HCPOA daughter Stanton Kidney at bedside, she is tearful, we reviewed his life story, he is described as an independent person who was also very spiritual. Offered chaplain support, discussed about full scope of comfort measures and comfort care medications, appreciate bedside RN assistance, anticipate d/c of Amiodarone and Levophed after additional family arrives to hold vigil and to pray with the patient.    Goals of Care and Additional Recommendations: Limitations on Scope of Treatment: Full Comfort Care  Code Status:    Code Status Orders  (From admission, onward)           Start     Ordered   04/28/2021 1203  Do not attempt resuscitation (DNR)  Continuous       Question Answer Comment  In the event of cardiac or respiratory ARREST Do not call a "code blue"   In the event of cardiac or respiratory ARREST Do not perform Intubation, CPR, defibrillation or ACLS   In  the event of cardiac or respiratory ARREST Use medication by any route, position, wound care, and other measures to relive pain and suffering. May use oxygen, suction and manual treatment of airway obstruction as needed for comfort.      04/20/2021 1204           Code Status History     Date Active Date Inactive Code Status Order ID Comments User Context   05/17/2021 0441 05/09/2021 1204 Full Code [id number]  Chotiner, Claudean Severance, MD ED       Prognosis:  Hours - Days  Discharge Planning: Anticipated Hospital Death ( Discussed that the patient might not be able to be transferred to residential hospice due to his current condition with patient's daughter at bedside.)  Care plan was discussed with  patient's HCPOA daughter Stanton Kidney, bedside RN and also with Dr Thora Lance PCCM.   Thank you for allowing the Palliative Medicine Team to assist in the care of this patient.   Time In: 10 Time Out: 10.35 Total Time 35 Prolonged Time Billed  no       Greater than 50%  of this time was spent counseling and coordinating care related to the above assessment and plan.  Rosalin Hawking, MD  Please contact Palliative Medicine Team phone at [phone] for questions and concerns.

## 2021-05-18 NOTE — Progress Notes (Signed)
Following a hand off from [facility].  Daughter, Son and son in law at bedside of the patient.  Family holding patient's hands and trying to be present, but as daughter says, hardest thing she has ever had to do.  Chaplain offered ministry of presence and acknowledged how hard it is to be present, but that they are present is everything for the patient.  Family requested prayer and chaplain prayed with space for family to participate.  Patient appears comfortable and no other needs expressed at this time.  Chaplain available as needed. Chaplain Agustin Cree, Mdiv.    05/17/2021 1822  Clinical Encounter Type  Visited With Patient and family together  Visit Type Follow-up;Spiritual support;Patient actively dying  Referral From Chaplain  Consult/Referral To Chaplain  Spiritual Encounters  Spiritual Needs Prayer;Emotional;Grief support

## 2021-05-18 DEATH — deceased

## 2023-06-20 IMAGING — CT CT HEAD W/O CM
3 series · 16 of 47 positions shown, 19 images · non-contrast
Comparison: None.

CLINICAL DATA: Mental status change, unknown cause

EXAM:
CT HEAD WITHOUT CONTRAST
TECHNIQUE: Contiguous axial images were obtained from the base of the skull
through the vertex without intravenous contrast.

[Series 2: head wo · axial · 0.47mm/px · z∈[-133,-8]mm · 10 of 31 slices shown, 13 images]
[im 3/31  brain]
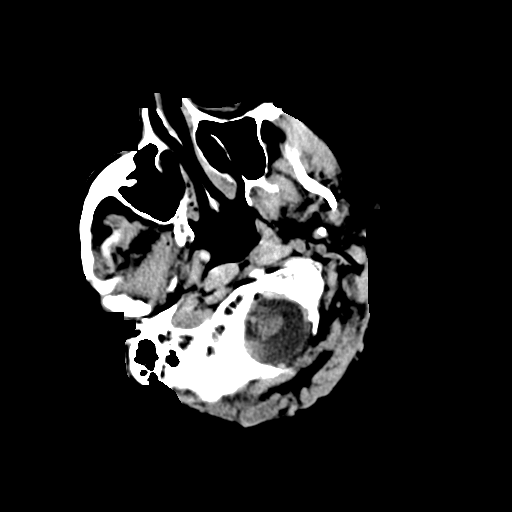
[im 3/31  bone]
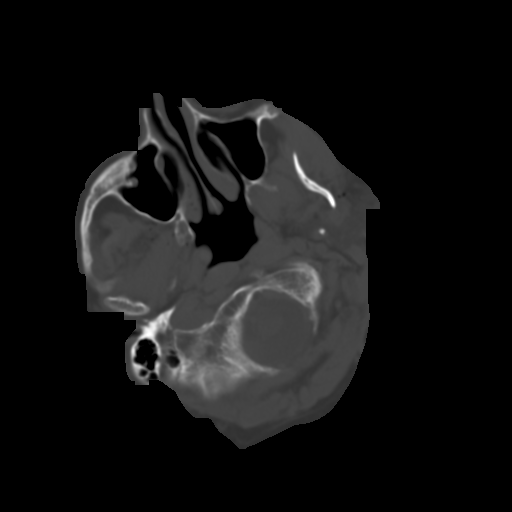
[im 6/31  brain]
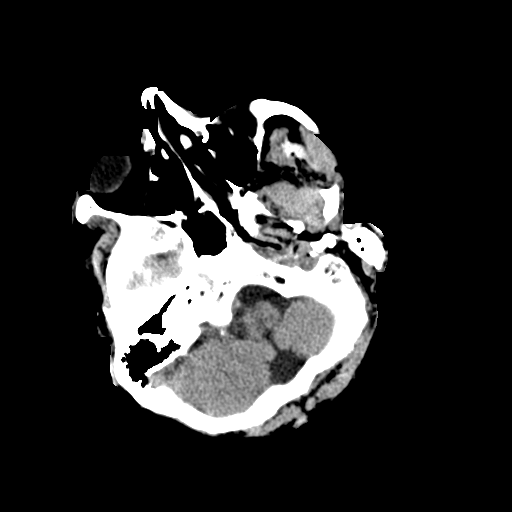
[im 9/31  brain]
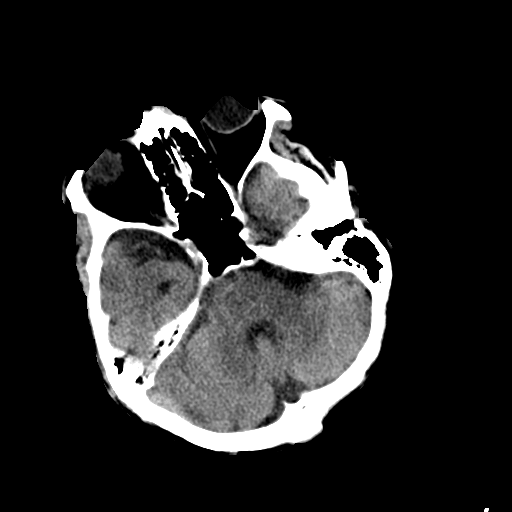
[im 11/31  brain]
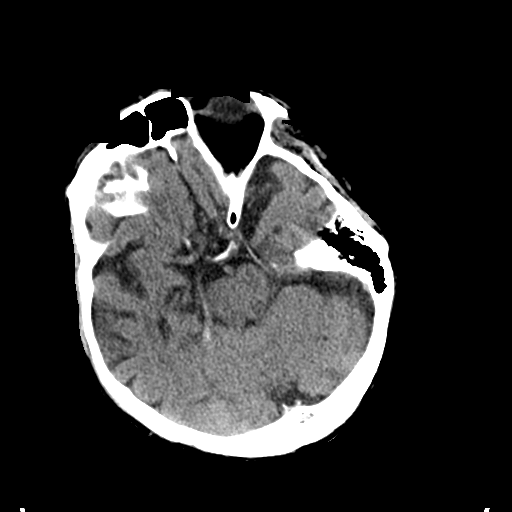
[im 14/31  brain]
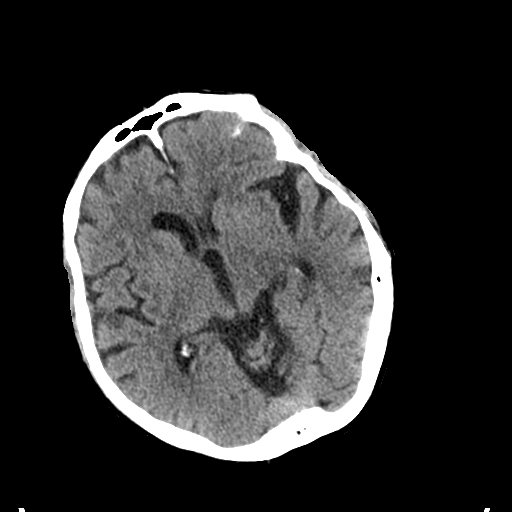
[im 14/31  bone]
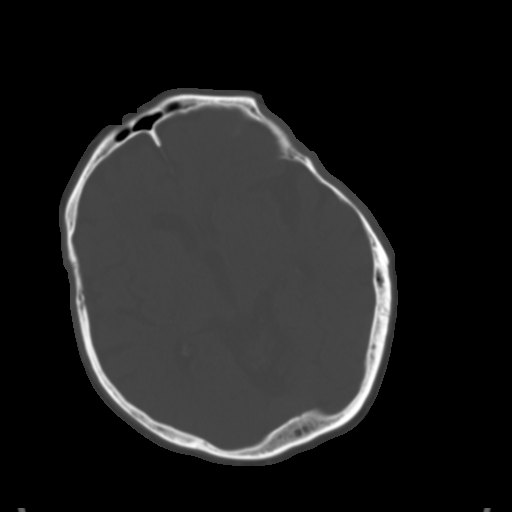
[im 17/31  brain]
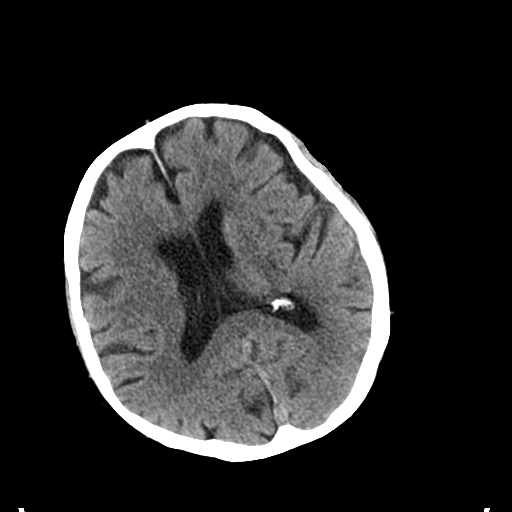
[im 20/31  brain]
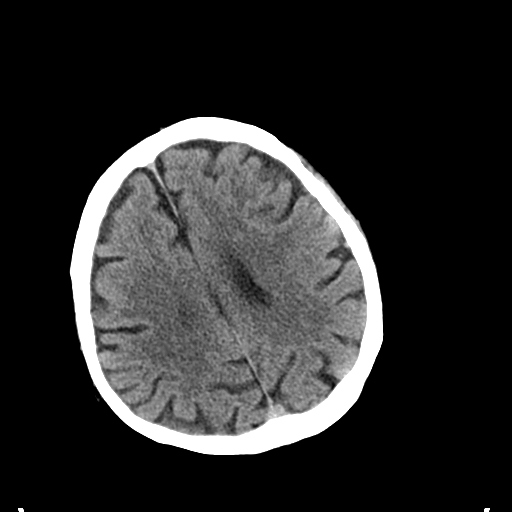
[im 23/31  brain]
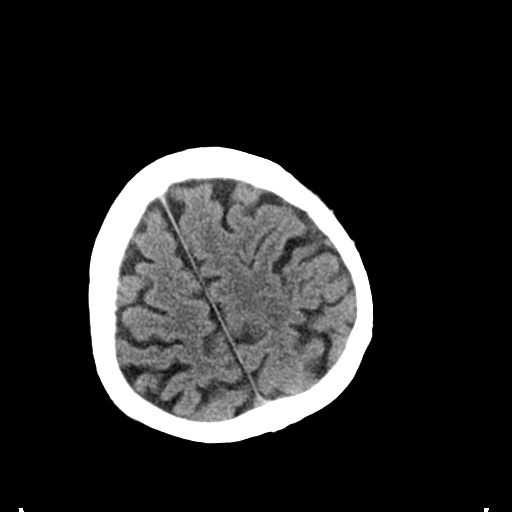
[im 25/31  brain]
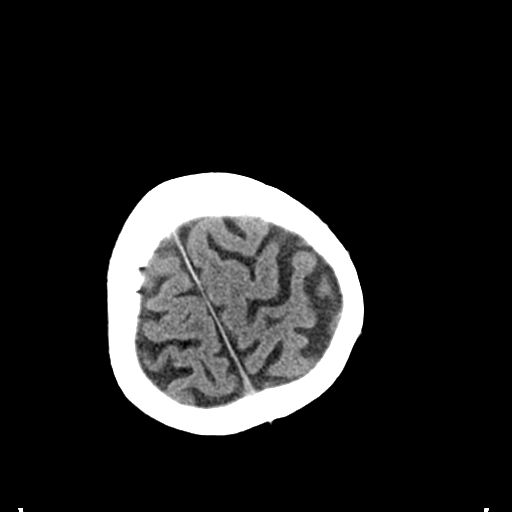
[im 25/31  bone]
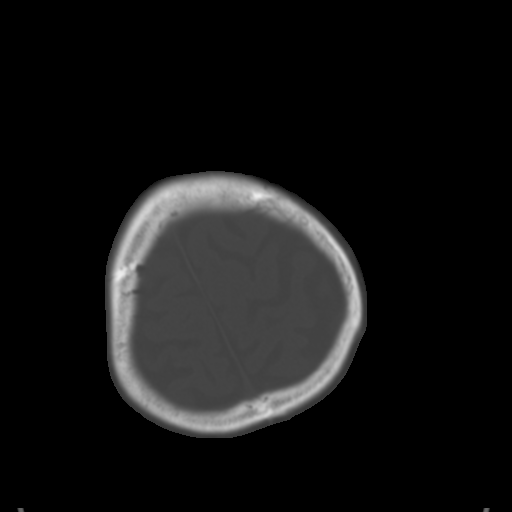
[im 28/31  brain]
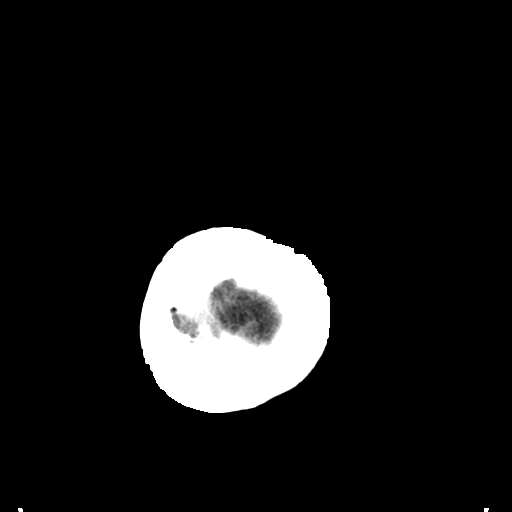

[Series 4: coronal soft tissue · coronal · 0.29mm/px · 3 of 63 slices shown]
[im 21/63  brain]
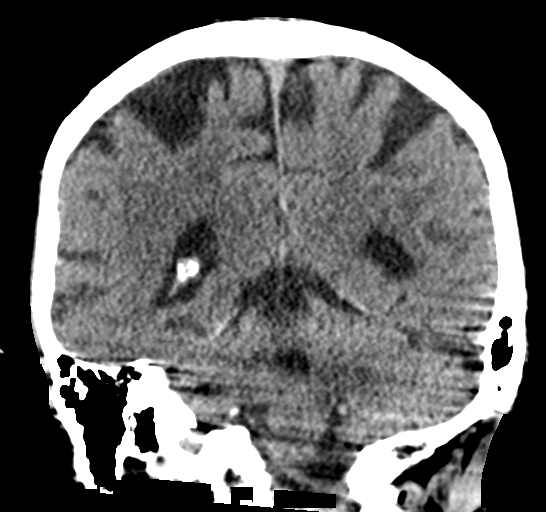
[im 28/63  brain]
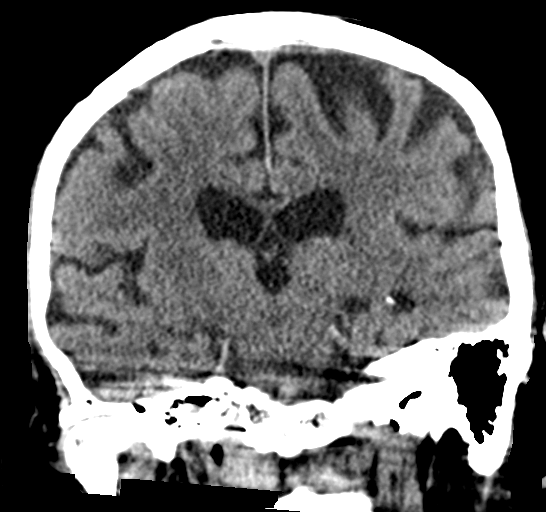
[im 35/63  brain]
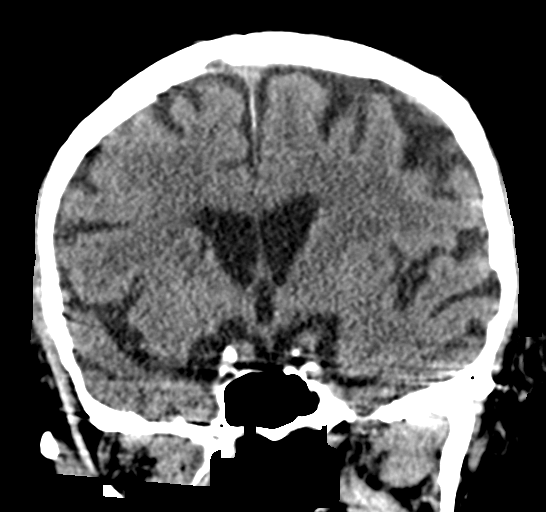

[Series 5: sagittal soft tissue · sagittal · 0.30mm/px · 3 of 52 slices shown]
[im 21/52  brain]
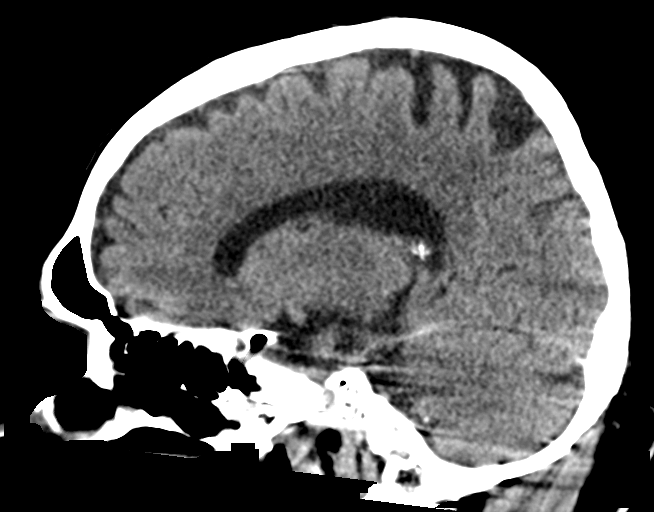
[im 26/52  brain]
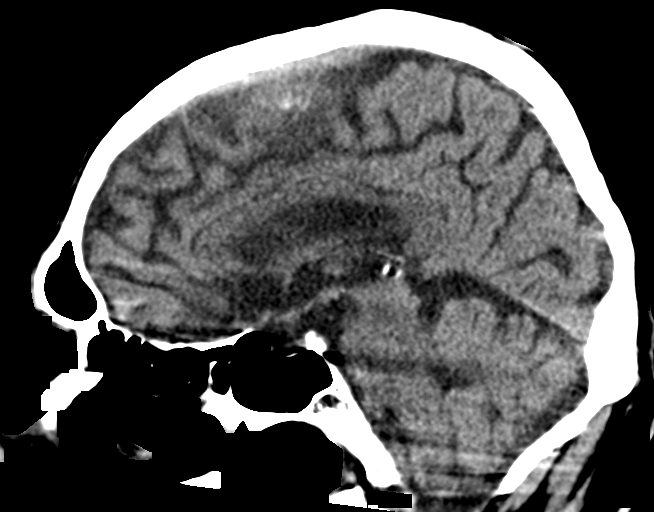
[im 32/52  brain]
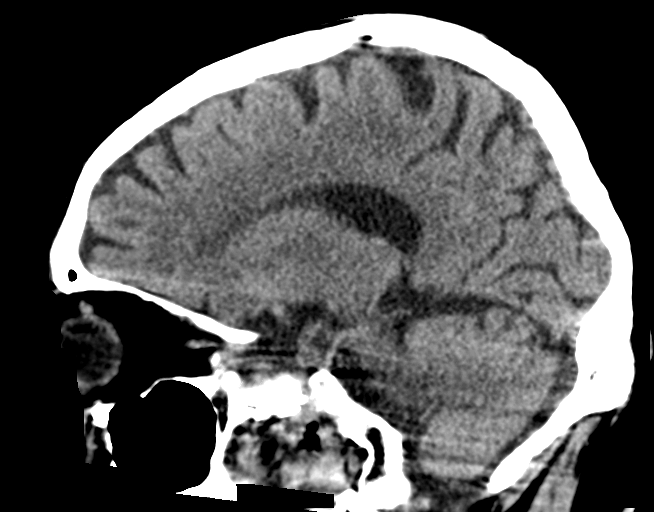

[16 of 47 positions shown; findings below may reference images not displayed]

FINDINGS: Brain: Age related atrophy. No intracranial hemorrhage, mass effect,
or midline shift. No hydrocephalus. The basilar cisterns are patent.
Mild periventricular chronic small vessel ischemia, normal for age.
No evidence of territorial infarct or acute ischemia. No extra-axial
or intracranial fluid collection.

Vascular: No hyperdense vessel or unexpected calcification.

Skull: No fracture or focal lesion.

Sinuses/Orbits: No acute finding. Occasional mucosal thickening of
paranasal sinuses. No mastoid effusion.

Other: None.
IMPRESSION: 1. No acute intracranial abnormality.
2. Age related atrophy and chronic small vessel ischemia.

## 2023-06-20 IMAGING — DX DG CHEST 1V PORT
1 series · 1 of 1 positions shown · non-contrast
Comparison: 10/19/2020

CLINICAL DATA: Possible sepsis, decreased blood pressure

EXAM:
PORTABLE CHEST 1 VIEW

[chest ap]
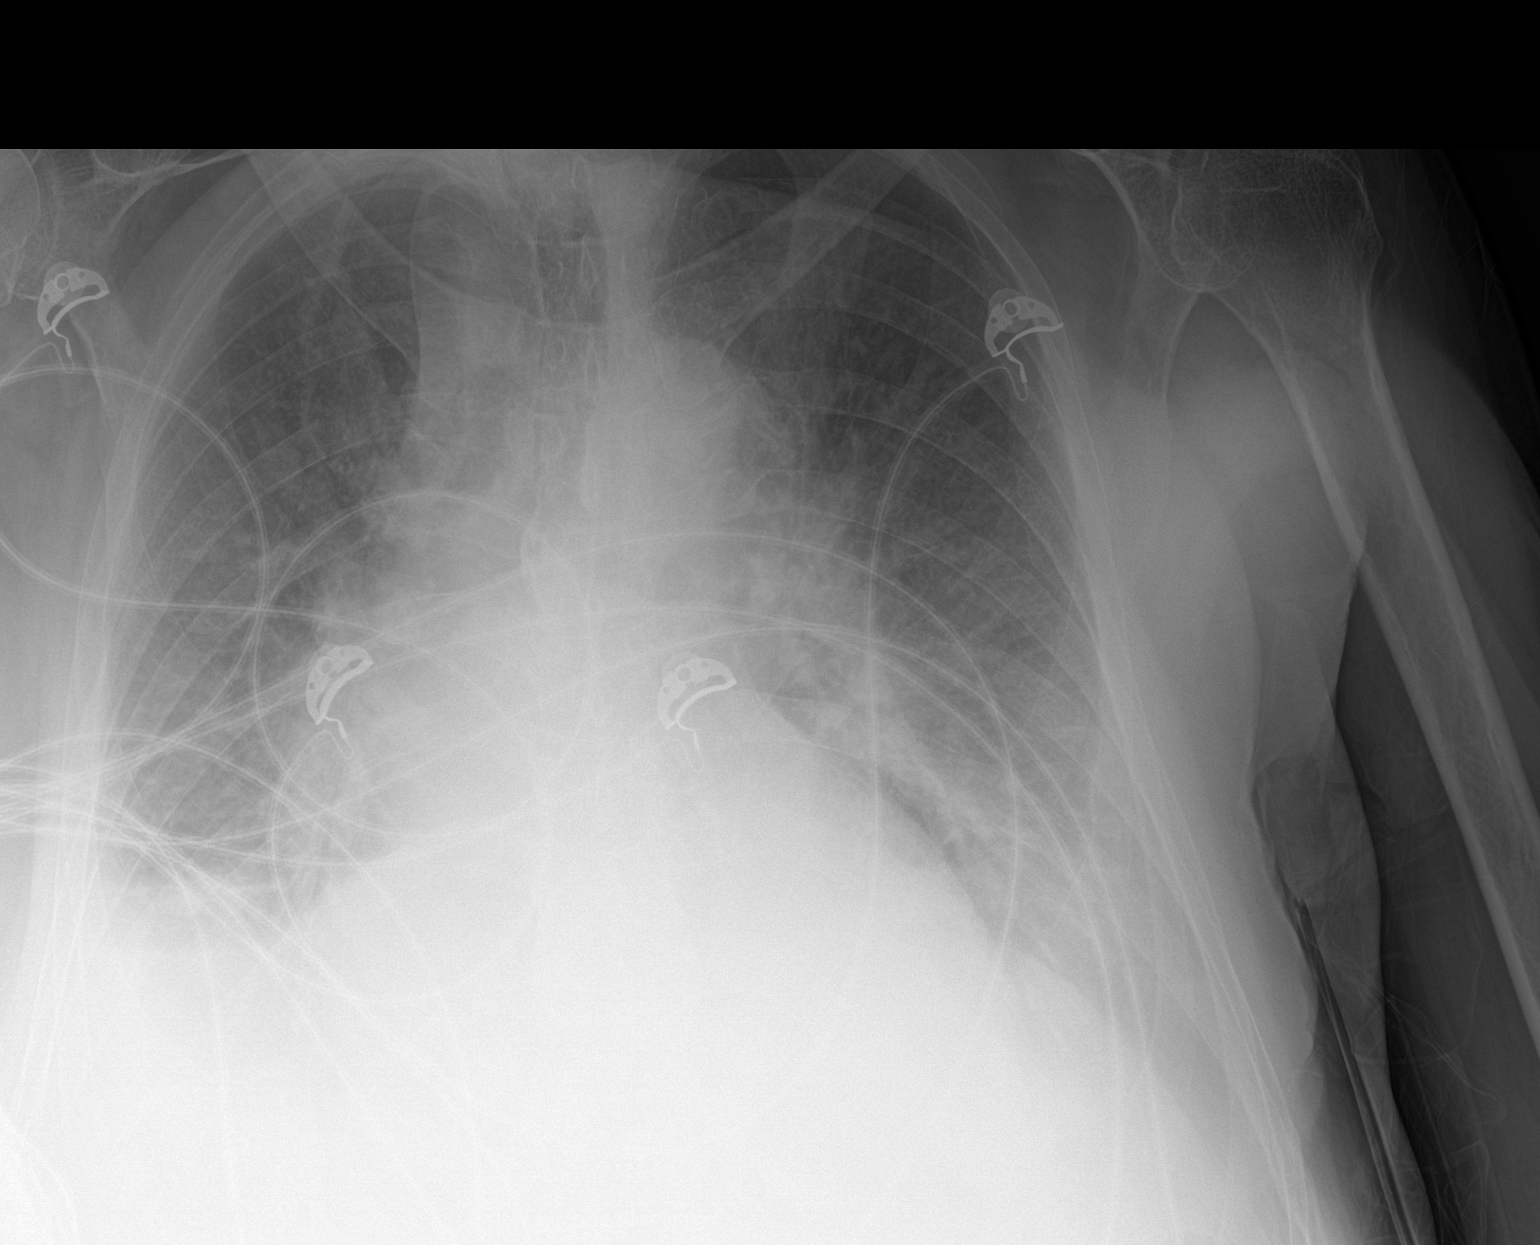

[1 of 1 positions shown; findings below may reference images not displayed]

FINDINGS: Cardiac shadow is enlarged. Tortuous thoracic aorta is noted.
Central vascular congestion is noted with mild interstitial edema.
Mild right basilar atelectasis is seen. No new bony abnormality is
noted.
IMPRESSION: Changes of mild CHF. Mild right basilar atelectasis is noted as
well.
